# Patient Record
Sex: Female | Born: 1975
Health system: Southern US, Community
[De-identification: ages and names within clinical notes are randomized; demographics above are authoritative.]

## PROBLEM LIST (undated history)

## (undated) ENCOUNTER — Inpatient Hospital Stay (HOSPITAL_COMMUNITY): Payer: Self-pay

## (undated) DIAGNOSIS — E669 Obesity, unspecified: Secondary | ICD-10-CM

## (undated) DIAGNOSIS — I10 Essential (primary) hypertension: Secondary | ICD-10-CM

## (undated) DIAGNOSIS — E119 Type 2 diabetes mellitus without complications: Secondary | ICD-10-CM

## (undated) HISTORY — DX: Type 2 diabetes mellitus without complications: E11.9

---

## 2005-12-26 ENCOUNTER — Emergency Department (HOSPITAL_COMMUNITY): Admission: EM | Admit: 2005-12-26 | Discharge: 2005-12-27 | Payer: Self-pay | Admitting: Emergency Medicine

## 2014-08-27 DIAGNOSIS — M25532 Pain in left wrist: Secondary | ICD-10-CM | POA: Insufficient documentation

## 2014-09-03 DIAGNOSIS — S62102D Fracture of unspecified carpal bone, left wrist, subsequent encounter for fracture with routine healing: Secondary | ICD-10-CM | POA: Insufficient documentation

## 2014-09-03 HISTORY — DX: Fracture of unspecified carpal bone, left wrist, subsequent encounter for fracture with routine healing: S62.102D

## 2015-04-11 ENCOUNTER — Ambulatory Visit (INDEPENDENT_AMBULATORY_CARE_PROVIDER_SITE_OTHER): Payer: BLUE CROSS/BLUE SHIELD | Admitting: Certified Nurse Midwife

## 2015-04-11 ENCOUNTER — Encounter: Payer: Self-pay | Admitting: Certified Nurse Midwife

## 2015-04-11 DIAGNOSIS — Z0000 Encounter for general adult medical examination without abnormal findings: Secondary | ICD-10-CM | POA: Diagnosis not present

## 2015-04-11 DIAGNOSIS — R7309 Other abnormal glucose: Secondary | ICD-10-CM | POA: Diagnosis not present

## 2015-04-11 DIAGNOSIS — Z01419 Encounter for gynecological examination (general) (routine) without abnormal findings: Secondary | ICD-10-CM

## 2015-04-11 LAB — COMPREHENSIVE METABOLIC PANEL
ALT: 12 U/L (ref 6–29)
AST: 15 U/L (ref 10–30)
Albumin: 4.2 g/dL (ref 3.6–5.1)
Alkaline Phosphatase: 86 U/L (ref 33–115)
BUN: 10 mg/dL (ref 7–25)
CALCIUM: 9.5 mg/dL (ref 8.6–10.2)
CO2: 28 mmol/L (ref 20–31)
Chloride: 102 mmol/L (ref 98–110)
Creat: 0.78 mg/dL (ref 0.50–1.10)
GLUCOSE: 95 mg/dL (ref 65–99)
POTASSIUM: 4.3 mmol/L (ref 3.5–5.3)
Sodium: 139 mmol/L (ref 135–146)
Total Bilirubin: 0.3 mg/dL (ref 0.2–1.2)
Total Protein: 7.9 g/dL (ref 6.1–8.1)

## 2015-04-11 LAB — CBC WITH DIFFERENTIAL/PLATELET
Basophils Absolute: 0 10*3/uL (ref 0.0–0.1)
Basophils Relative: 0 % (ref 0–1)
Eosinophils Absolute: 0.3 10*3/uL (ref 0.0–0.7)
Eosinophils Relative: 3 % (ref 0–5)
HEMATOCRIT: 38.4 % (ref 36.0–46.0)
HEMOGLOBIN: 12.6 g/dL (ref 12.0–15.0)
LYMPHS ABS: 3.3 10*3/uL (ref 0.7–4.0)
LYMPHS PCT: 37 % (ref 12–46)
MCH: 26.9 pg (ref 26.0–34.0)
MCHC: 32.8 g/dL (ref 30.0–36.0)
MCV: 82.1 fL (ref 78.0–100.0)
MONO ABS: 1 10*3/uL (ref 0.1–1.0)
MONOS PCT: 11 % (ref 3–12)
MPV: 10.8 fL (ref 8.6–12.4)
NEUTROS ABS: 4.4 10*3/uL (ref 1.7–7.7)
NEUTROS PCT: 49 % (ref 43–77)
Platelets: 379 10*3/uL (ref 150–400)
RBC: 4.68 MIL/uL (ref 3.87–5.11)
RDW: 15.7 % — ABNORMAL HIGH (ref 11.5–15.5)
WBC: 8.9 10*3/uL (ref 4.0–10.5)

## 2015-04-11 LAB — TRIGLYCERIDES: Triglycerides: 93 mg/dL (ref <150)

## 2015-04-11 LAB — CHOLESTEROL, TOTAL: CHOLESTEROL: 175 mg/dL (ref 125–200)

## 2015-04-11 LAB — HDL CHOLESTEROL: HDL: 52 mg/dL (ref >=46)

## 2015-04-11 MED ORDER — METFORMIN HCL 500 MG PO TABS: 500.0000 mg | ORAL_TABLET | Freq: Two times a day (BID) | ORAL | Status: DC

## 2015-04-11 NOTE — Progress Notes (Signed)
Patient ID: Jacqueline Ward, female   DOB: October 31, 1975, 39 y.o.   MRN: [id number]    Subjective:     Jacqueline Ward is a 39 y.o. female here for a routine exam.  Current complaints: irregular periods, over the past two years has put on about 150# or more.  Discussed blood pressure.  Elevated today in office, encouraged f/u with PCP.  Lab work completed for general exam.  Stated just joined [employer] and is going to work out.  Has a hx of fibroids, states she will bring in report from abdominal CT scan.  Mother had uterine cancer 15 years ago.   Patient is a pediatrician.  Discussed nutrition.  Does not desire to have gastric bypass surgery.  Declines blood STD testing.   Is having irregular periods, last two months have been regular, with periods lasting about 5 days.    Personal health questionnaire:  Is patient Ashkenazi Jewish, have a family history of breast and/or ovarian cancer: yes Is there a family history of uterine cancer diagnosed at age < 28, gastrointestinal cancer, urinary tract cancer, family member who is a Personnel officer syndrome-associated carrier: no Is the patient overweight and hypertensive, family history of diabetes, personal history of gestational diabetes, preeclampsia or PCOS: yes Is patient over 79, have PCOS,  family history of premature CHD under age 84, diabetes, smoke, have hypertension or peripheral artery disease:  yes At any time, has a partner hit, kicked or otherwise hurt or frightened you?: no Over the past 2 weeks, have you felt down, depressed or hopeless?: no Over the past 2 weeks, have you felt little interest or pleasure in doing things?:no   Gynecologic History Patient's last menstrual period was 04/03/2015. Contraception: abstinence Last Pap: 4 years ago. Results were: normal according to the patient.   Last mammogram: None.   Obstetric History OB History  Gravida Para Term Preterm AB SAB TAB Ectopic Multiple Living         History  reviewed. No pertinent past medical history.  History reviewed. No pertinent past surgical history.   Current outpatient prescriptions:  .  esomeprazole (NEXIUM) 20 MG capsule, Take 20 mg by mouth daily at 12 noon., Disp: , Rfl:  .  Probiotic Product (PROBIOTIC ADVANCED PO), Take by mouth., Disp: , Rfl:  No Known Allergies  Social History  Substance Use Topics  . Smoking status: Never Smoker   . Smokeless tobacco: Never Used  . Alcohol Use: No    Family History  Problem Relation Age of Onset  . Cancer Mother   . Hypertension Mother   . Pancreatitis Father   . Diabetes Father       Review of Systems  Constitutional: negative for fatigue and weight loss, + weight gain Respiratory: negative for cough and wheezing Cardiovascular: negative for chest pain, fatigue and palpitations Gastrointestinal: negative for abdominal pain and change in bowel habits Musculoskeletal:negative for myalgias Neurological: negative for gait problems and tremors Behavioral/Psych: negative for abusive relationship, depression Endocrine: negative for temperature intolerance   Genitourinary:negative for abnormal menstrual periods, genital lesions, hot flashes, sexual problems and vaginal discharge Integument/breast: negative for breast lump, breast tenderness, nipple discharge and skin lesion(s)    Objective:       Temp(Src) 99 F (37.2 C)  Ht 5' 6.5" (1.689 m)  Wt 311 lb (141.069 kg)  BMI 49.45 kg/m2  LMP 04/03/2015 General:   alert  Skin:   no rash  or abnormalities  Lungs:   clear to auscultation bilaterally  Heart:   regular rate and rhythm, S1, S2 normal, no murmur, click, rub or gallop  Breasts:   normal without suspicious masses, skin or nipple changes or axillary nodes  Abdomen:  normal findings: no organomegaly, soft, non-tender and no hernia obese  Pelvis:  External genitalia: normal general appearance Urinary system: urethral meatus normal and bladder without fullness,  nontender Vaginal: normal without tenderness, induration or masses Cervix: normal appearance Adnexa: normal bimanual exam, difficult to assess d/t body habitus Uterus: retroverted and non-tender, normal size   Lab Review Urine pregnancy test Labs reviewed no Radiologic studies reviewed no  50% of 30 min visit spent on counseling and coordination of care.   Assessment:    Healthy female exam.   Morbid obesity  Hx of elevated HgbA1C  Hx of uterine fibroids  Irregular periods  Plan:   F/U with PCP for elevated blood pressure in office today.   Education reviewed: calcium supplements, depression evaluation, low fat, low cholesterol diet, safe sex/STD prevention, self breast exams, skin cancer screening and weight bearing exercise. Contraception: abstinence. Mammogram ordered. Follow up in: 3 months.   Meds ordered this encounter  Medications  . Probiotic Product (PROBIOTIC ADVANCED PO)    Sig: Take by mouth.  . esomeprazole (NEXIUM) 20 MG capsule    Sig: Take 20 mg by mouth daily at 12 noon.   No orders of the defined types were placed in this encounter.

## 2015-04-12 LAB — HEMOGLOBIN A1C
HEMOGLOBIN A1C: 7.2 % — AB (ref <5.7)
MEAN PLASMA GLUCOSE: 160 mg/dL — AB (ref <117)

## 2015-04-13 LAB — PAP, TP IMAGING W/ HPV RNA, RFLX HPV TYPE 16,18/45: HPV mRNA, High Risk: NOT DETECTED

## 2015-07-10 ENCOUNTER — Ambulatory Visit: Payer: BLUE CROSS/BLUE SHIELD | Admitting: Certified Nurse Midwife

## 2015-07-11 ENCOUNTER — Ambulatory Visit: Payer: BLUE CROSS/BLUE SHIELD | Admitting: Certified Nurse Midwife

## 2015-08-07 ENCOUNTER — Ambulatory Visit: Payer: BLUE CROSS/BLUE SHIELD | Admitting: Certified Nurse Midwife

## 2016-02-09 ENCOUNTER — Encounter (HOSPITAL_COMMUNITY): Payer: Self-pay | Admitting: Emergency Medicine

## 2016-02-09 ENCOUNTER — Emergency Department (HOSPITAL_COMMUNITY)
Admission: EM | Admit: 2016-02-09 | Discharge: 2016-02-09 | Disposition: A | Discharge status: Home / Self Care | Payer: BLUE CROSS/BLUE SHIELD | Attending: Emergency Medicine | Admitting: Emergency Medicine

## 2016-02-09 ENCOUNTER — Emergency Department (HOSPITAL_COMMUNITY): Payer: BLUE CROSS/BLUE SHIELD

## 2016-02-09 DIAGNOSIS — R000 Tachycardia, unspecified: Secondary | ICD-10-CM | POA: Diagnosis not present

## 2016-02-09 DIAGNOSIS — R51 Headache: Secondary | ICD-10-CM | POA: Diagnosis present

## 2016-02-09 DIAGNOSIS — Z7982 Long term (current) use of aspirin: Secondary | ICD-10-CM | POA: Insufficient documentation

## 2016-02-09 DIAGNOSIS — I10 Essential (primary) hypertension: Secondary | ICD-10-CM | POA: Diagnosis not present

## 2016-02-09 LAB — CBC
HCT: 39.4 % (ref 36.0–46.0)
Hemoglobin: 13.5 g/dL (ref 12.0–15.0)
MCH: 27.5 pg (ref 26.0–34.0)
MCHC: 34.3 g/dL (ref 30.0–36.0)
MCV: 80.2 fL (ref 78.0–100.0)
PLATELETS: 397 10*3/uL (ref 150–400)
RBC: 4.91 MIL/uL (ref 3.87–5.11)
RDW: 14.9 % (ref 11.5–15.5)
WBC: 9.6 10*3/uL (ref 4.0–10.5)

## 2016-02-09 LAB — COMPREHENSIVE METABOLIC PANEL
ALK PHOS: 92 U/L (ref 38–126)
ALT: 19 U/L (ref 14–54)
AST: 17 U/L (ref 15–41)
Albumin: 4.2 g/dL (ref 3.5–5.0)
Anion gap: 8 (ref 5–15)
BUN: 12 mg/dL (ref 6–20)
CALCIUM: 9.2 mg/dL (ref 8.9–10.3)
CO2: 26 mmol/L (ref 22–32)
CREATININE: 0.74 mg/dL (ref 0.44–1.00)
Chloride: 103 mmol/L (ref 101–111)
Glucose, Bld: 104 mg/dL — ABNORMAL HIGH (ref 65–99)
Potassium: 3.6 mmol/L (ref 3.5–5.1)
SODIUM: 137 mmol/L (ref 135–145)
Total Bilirubin: 0.3 mg/dL (ref 0.3–1.2)
Total Protein: 8.8 g/dL — ABNORMAL HIGH (ref 6.5–8.1)

## 2016-02-09 LAB — LIPASE, BLOOD: Lipase: 24 U/L (ref 11–51)

## 2016-02-09 MED ORDER — LABETALOL HCL 5 MG/ML IV SOLN
10.0000 mg | Freq: Once | INTRAVENOUS | Status: AC
  Administered 2016-02-09: 10 mg via INTRAVENOUS
  Filled 2016-02-09: qty 4

## 2016-02-09 MED ORDER — HYDROCHLOROTHIAZIDE 25 MG PO TABS: 25.0000 mg | ORAL_TABLET | Freq: Every day | ORAL | Status: DC

## 2016-02-09 NOTE — ED Notes (Signed)
Pt from work with complaints of hypertension, headache, and nausea. Pt states she has had symptoms for a week and a half. 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 Pt states she has felt her heart racing on and off in this week. Pt also states that she is feeling anxious at this time. Pt states that her HA has subsided since she took 81mg  of motrin aroun 1430. Pt states she has been told she has htn, but never prescribed anything for it

## 2016-02-09 NOTE — Discharge Instructions (Signed)
Hypertension Hypertension, commonly called high blood pressure, is when the force of blood pumping through your arteries is too strong. Your arteries are the blood vessels that carry blood from your heart throughout your body. A blood pressure reading consists of a higher number over a lower number, such as 110/72. The higher number (systolic) is the pressure inside your arteries when your heart pumps. The lower number (diastolic) is the pressure inside your arteries when your heart relaxes. Ideally you want your blood pressure below 120/80. Hypertension forces your heart to work harder to pump blood. Your arteries may become narrow or stiff. Having untreated or uncontrolled hypertension can cause heart attack, stroke, kidney disease, and other problems. RISK FACTORS Some risk factors for high blood pressure are controllable. Others are not.  Risk factors you cannot control include:   Race. You may be at higher risk if you are African American.  Age. Risk increases with age.  Gender. Men are at higher risk than women before age 45 years. After age 65, women are at higher risk than men. Risk factors you can control include:  Not getting enough exercise or physical activity.  Being overweight.  Getting too much fat, sugar, calories, or salt in your diet.  Drinking too much alcohol. SIGNS AND SYMPTOMS Hypertension does not usually cause signs or symptoms. Extremely high blood pressure (hypertensive crisis) may cause headache, anxiety, shortness of breath, and nosebleed. DIAGNOSIS To check if you have hypertension, your health care provider will measure your blood pressure while you are seated, with your arm held at the level of your heart. It should be measured at least twice using the same arm. Certain conditions can cause a difference in blood pressure between your right and left arms. A blood pressure reading that is higher than normal on one occasion does not mean that you need treatment. If  it is not clear whether you have high blood pressure, you may be asked to return on a different day to have your blood pressure checked again. Or, you may be asked to monitor your blood pressure at home for 1 or more weeks. TREATMENT Treating high blood pressure includes making lifestyle changes and possibly taking medicine. Living a healthy lifestyle can help lower high blood pressure. You may need to change some of your habits. Lifestyle changes may include:  Following the DASH diet. This diet is high in fruits, vegetables, and whole grains. It is low in salt, red meat, and added sugars.  Keep your sodium intake below 2,300 mg per day.  Getting at least 30-45 minutes of aerobic exercise at least 4 times per week.  Losing weight if necessary.  Not smoking.  Limiting alcoholic beverages.  Learning ways to reduce stress. Your health care provider may prescribe medicine if lifestyle changes are not enough to get your blood pressure under control, and if one of the following is true:  You are 18-59 years of age and your systolic blood pressure is above 140.  You are 60 years of age or older, and your systolic blood pressure is above 150.  Your diastolic blood pressure is above 90.  You have diabetes, and your systolic blood pressure is over 140 or your diastolic blood pressure is over 90.  You have kidney disease and your blood pressure is above 140/90.  You have heart disease and your blood pressure is above 140/90. Your personal target blood pressure may vary depending on your medical conditions, your age, and other factors. HOME CARE INSTRUCTIONS    Have your blood pressure rechecked as directed by your health care provider.   Take medicines only as directed by your health care provider. Follow the directions carefully. Blood pressure medicines must be taken as prescribed. The medicine does not work as well when you skip doses. Skipping doses also puts you at risk for  problems.  Do not smoke.   Monitor your blood pressure at home as directed by your health care provider. SEEK MEDICAL CARE IF:   You think you are having a reaction to medicines taken.  You have recurrent headaches or feel dizzy.  You have swelling in your ankles.  You have trouble with your vision. SEEK IMMEDIATE MEDICAL CARE IF:  You develop a severe headache or confusion.  You have unusual weakness, numbness, or feel faint.  You have severe chest or abdominal pain.  You vomit repeatedly.  You have trouble breathing. MAKE SURE YOU:   Understand these instructions.  Will watch your condition.  Will get help right away if you are not doing well or get worse.   This information is not intended to replace advice given to you by your health care provider. Make sure you discuss any questions you have with your health care provider.   Document Released: 07/20/2005 Document Revised: 12/04/2014 Document Reviewed: 05/12/2013 Elsevier Interactive Patient Education 2016 Elsevier Inc.  

## 2016-02-09 NOTE — ED Provider Notes (Signed)
CSN: [id number]     Arrival date & time 02/09/16  1624 History   First MD Initiated Contact with Patient 02/09/16 1650     Chief Complaint  Patient presents with  . Nausea  . Hypertension  . Headache     (Consider location/radiation/quality/duration/timing/severity/associated sxs/prior Treatment) HPI Comments: 40 year old female presents with one-day history of increased blood pressure without headache and nausea. Symptoms present for approximately week and half and worse today. Denies any visual changes. No ataxia. Diagnosed with diabetes in the past but is not taking medication at this time. No prior history of taking medication for hypertension. Denies any chest pain or shortness of breath. She did take aspirin prior to arrival.  Patient is a 40 y.o. female presenting with hypertension and headaches. The history is provided by the patient.  Hypertension Associated symptoms include headaches.  Headache   History reviewed. No pertinent past medical history. History reviewed. No pertinent past surgical history. Family History  Problem Relation Age of Onset  . Cancer Mother   . Hypertension Mother   . Pancreatitis Father   . Diabetes Father    Social History  Substance Use Topics  . Smoking status: Never Smoker   . Smokeless tobacco: Never Used  . Alcohol Use: No   OB History    Gravida Para Term Preterm AB TAB SAB Ectopic Multiple Living       Review of Systems  Neurological: Positive for headaches.  All other systems reviewed and are negative.     Allergies  Review of patient's allergies indicates no known allergies.  Home Medications   Prior to Admission medications   Medication Sig Start Date End Date Taking? Authorizing Provider  aspirin EC 81 MG tablet Take 81 mg by mouth daily as needed (high blood pressure).   Yes Historical Provider, MD  metFORMIN (GLUCOPHAGE) 500 MG tablet Take 1 tablet (500 mg total) by mouth 2 (two) times daily with  a meal. Patient not taking: Reported on 02/09/2016 04/11/15   Rachelle A Denney, CNM   BP 197/142 mmHg  Pulse 123  Temp(Src) 98.4 F (36.9 C) (Oral)  Resp 18  SpO2 97%  LMP 01/30/2016 Physical Exam  Constitutional: She is oriented to person, place, and time. She appears well-developed and well-nourished.  Non-toxic appearance. No distress.  HENT:  Head: Normocephalic and atraumatic.  Eyes: Conjunctivae, EOM and lids are normal. Pupils are equal, round, and reactive to light.  Neck: Normal range of motion. Neck supple. No tracheal deviation present. No thyroid mass present.  Cardiovascular: Regular rhythm and normal heart sounds.  Tachycardia present.  Exam reveals no gallop.   No murmur heard. Pulmonary/Chest: Effort normal and breath sounds normal. No stridor. No respiratory distress. She has no decreased breath sounds. She has no wheezes. She has no rhonchi. She has no rales.  Abdominal: Soft. Normal appearance and bowel sounds are normal. She exhibits no distension. There is no tenderness. There is no rebound and no CVA tenderness.  Musculoskeletal: Normal range of motion. She exhibits no edema or tenderness.  Neurological: She is alert and oriented to person, place, and time. She has normal strength. No cranial nerve deficit or sensory deficit. GCS eye subscore is 4. GCS verbal subscore is 5. GCS motor subscore is 6.  Skin: Skin is warm and dry. No abrasion and no rash noted.  Psychiatric: She has a normal mood and affect. Her speech is normal and behavior is normal.  Nursing note and vitals reviewed.   ED Course  Procedures (including critical care time) Labs Review Labs Reviewed  CBC  LIPASE, BLOOD  COMPREHENSIVE METABOLIC PANEL  URINALYSIS, ROUTINE W REFLEX MICROSCOPIC (NOT AT [facility])    Imaging Review No results found. I have personally reviewed and evaluated these images and lab results as part of my medical decision-making.   EKG Interpretation   Date/Time:  Sunday  February 09 2016 17:16:13 EDT Ventricular Rate:  109 PR Interval:    QRS Duration: 97 QT Interval:  362 QTC Calculation: 488 R Axis:   59 Text Interpretation:  Sinus tachycardia Borderline prolonged QT interval  Confirmed by Freida Busman  MD, Kherington Meraz ([id number]) on 02/09/2016 5:37:07 PM      MDM   Final diagnoses:  None    Patient had blood pressure treated with 2 mg of IV labetalol. She feels better at this time. Headache has resolved. Head CT without acute findings. Renal function is normal. Will place patient on diuretic and she has a follow-up scheduled with a new primary care provider.    Lorre Nick, MD 02/09/16 1850

## 2016-02-27 DIAGNOSIS — E119 Type 2 diabetes mellitus without complications: Secondary | ICD-10-CM | POA: Insufficient documentation

## 2016-03-05 DIAGNOSIS — I10 Essential (primary) hypertension: Secondary | ICD-10-CM

## 2017-08-03 DIAGNOSIS — K115 Sialolithiasis: Secondary | ICD-10-CM

## 2017-08-03 HISTORY — DX: Sialolithiasis: K11.5

## 2018-04-19 ENCOUNTER — Emergency Department (HOSPITAL_COMMUNITY)
Admission: EM | Admit: 2018-04-19 | Discharge: 2018-04-19 | Disposition: A | Discharge status: Home / Self Care | Payer: BLUE CROSS/BLUE SHIELD | Attending: Emergency Medicine | Admitting: Emergency Medicine

## 2018-04-19 ENCOUNTER — Encounter (HOSPITAL_COMMUNITY): Payer: Self-pay | Admitting: Emergency Medicine

## 2018-04-19 DIAGNOSIS — I10 Essential (primary) hypertension: Secondary | ICD-10-CM | POA: Insufficient documentation

## 2018-04-19 DIAGNOSIS — Z79899 Other long term (current) drug therapy: Secondary | ICD-10-CM | POA: Insufficient documentation

## 2018-04-19 DIAGNOSIS — R0789 Other chest pain: Secondary | ICD-10-CM

## 2018-04-19 HISTORY — DX: Obesity, unspecified: E66.9

## 2018-04-19 HISTORY — DX: Essential (primary) hypertension: I10

## 2018-04-19 LAB — BASIC METABOLIC PANEL
Anion gap: 9 (ref 5–15)
BUN: 14 mg/dL (ref 6–20)
CHLORIDE: 101 mmol/L (ref 98–111)
CO2: 29 mmol/L (ref 22–32)
Calcium: 8.9 mg/dL (ref 8.9–10.3)
Creatinine, Ser: 0.77 mg/dL (ref 0.44–1.00)
GFR calc Af Amer: >60 mL/min (ref >60)
GFR calc non Af Amer: >60 mL/min (ref >60)
GLUCOSE: 122 mg/dL — AB (ref 70–99)
POTASSIUM: 3.4 mmol/L — AB (ref 3.5–5.1)
Sodium: 139 mmol/L (ref 135–145)

## 2018-04-19 LAB — CBC WITH DIFFERENTIAL/PLATELET
Basophils Absolute: 0 10*3/uL (ref 0.0–0.1)
Basophils Relative: 0 %
EOS PCT: 3 %
Eosinophils Absolute: 0.2 10*3/uL (ref 0.0–0.7)
HCT: 37.7 % (ref 36.0–46.0)
Hemoglobin: 12.2 g/dL (ref 12.0–15.0)
LYMPHS ABS: 2.6 10*3/uL (ref 0.7–4.0)
LYMPHS PCT: 41 %
MCH: 27.1 pg (ref 26.0–34.0)
MCHC: 32.4 g/dL (ref 30.0–36.0)
MCV: 83.8 fL (ref 78.0–100.0)
MONO ABS: 0.4 10*3/uL (ref 0.1–1.0)
MONOS PCT: 6 %
Neutro Abs: 3.1 10*3/uL (ref 1.7–7.7)
Neutrophils Relative %: 50 %
PLATELETS: 419 10*3/uL — AB (ref 150–400)
RBC: 4.5 MIL/uL (ref 3.87–5.11)
RDW: 14.7 % (ref 11.5–15.5)
WBC: 6.3 10*3/uL (ref 4.0–10.5)

## 2018-04-19 LAB — URINALYSIS, ROUTINE W REFLEX MICROSCOPIC
BILIRUBIN URINE: NEGATIVE
Glucose, UA: NEGATIVE mg/dL
Hgb urine dipstick: NEGATIVE
Ketones, ur: NEGATIVE mg/dL
Leukocytes, UA: NEGATIVE
NITRITE: NEGATIVE
PH: 6 (ref 5.0–8.0)
Protein, ur: NEGATIVE mg/dL
SPECIFIC GRAVITY, URINE: 1.015 (ref 1.005–1.030)

## 2018-04-19 LAB — I-STAT TROPONIN, ED: Troponin i, poc: 0 ng/mL (ref 0.00–0.08)

## 2018-04-19 MED ORDER — CLONIDINE HCL 0.1 MG PO TABS
0.1000 mg | ORAL_TABLET | Freq: Once | ORAL | Status: AC
Start: 2018-04-19 — End: 2018-04-19
  Administered 2018-04-19: 0.1 mg via ORAL
  Filled 2018-04-19: qty 1

## 2018-04-19 NOTE — ED Notes (Signed)
ED Provider at bedside. 

## 2018-04-19 NOTE — ED Provider Notes (Signed)
[facility]-EMERGENCY DEPT Provider Note   CSN: [id number] Arrival date & time: 04/19/18  1038     History   Chief Complaint Chief Complaint  Patient presents with  . Hypertension  . Chest Pain    HPI Jacqueline Ward is a 42 y.o. female.  She presents here from employee health clinic with elevated blood pressure and some vague chest tightness.  She has a history of hypertension and is on medication for that she has been taking.  She has not had her blood pressure checked in a while so she does not know if this is a new change.  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  She says the chest discomfort is new but she says it is mild and she would make much of it.  No cough no shortness of breath no abdominal pain no nausea no vomiting.  No headache or blurry vision.  The history is provided by the patient.  Chest Pain   This is a new problem. The current episode started 1 to 2 hours ago. The problem occurs constantly. The problem has not changed since onset.The pain is present in the lateral region. The pain is mild. The quality of the pain is described as pressure-like. The pain does not radiate. Pertinent negatives include no abdominal pain, no cough, no diaphoresis, no fever, no headaches, no shortness of breath and no syncope. She has tried nothing for the symptoms. The treatment provided no relief.  Her past medical history is significant for hypertension.    Past Medical History:  Diagnosis Date  . Hypertension   . Obese     Patient Active Problem List   Diagnosis Date Noted  . Morbid obesity (HCC) 04/11/2015    History reviewed. No pertinent surgical history.   OB History    Gravida  0   Para  0   Term  0   Preterm  0   AB  0   Living  0     SAB  0   TAB  0   Ectopic  0   Multiple  0   Live Births                 Home Medications    Prior to Admission medications   Medication Sig Start Date End Date Taking? Authorizing Provider  aspirin EC 81 MG tablet Take 81 mg by mouth daily as needed (high blood pressure).    [provider]  hydrochlorothiazide (HYDRODIURIL) 25 MG tablet Take 1 tablet (25 mg total) by mouth daily. 02/09/16   Lorre Nick, MD  metFORMIN (GLUCOPHAGE) 500 MG tablet Take 1 tablet (500 mg total) by mouth 2 (two) times daily with a meal. Patient not taking: Reported on 02/09/2016 04/11/15   Roe Coombs, CNM    Family History Family History  Problem Relation Age of Onset  . Cancer Mother   . Hypertension Mother   . Pancreatitis Father   . Diabetes Father     Social History Social History   Tobacco Use  . Smoking status: Never Smoker  . Smokeless tobacco: Never Used  Substance Use Topics  . Alcohol use: No    Alcohol/week: 0.0 standard drinks  . Drug use: No     Allergies   Patient has no known allergies.  Review of Systems Review of Systems  Constitutional: Negative for diaphoresis and fever.  HENT: Negative for sore throat.   Eyes: Negative for visual disturbance.  Respiratory: Negative for cough and shortness of breath.   Cardiovascular: Positive for chest pain. Negative for syncope.  Gastrointestinal: Negative for abdominal pain.  Genitourinary: Negative for dysuria.  Musculoskeletal: Negative for neck pain.  Skin: Negative for rash.  Neurological: Negative for headaches.     Physical Exam Updated Vital Signs BP (!) 192/123 (BP Location: Right Arm)   Pulse 99   Temp 98.8 F (37.1 C) (Oral)   Resp 18   Ht 5\' 6"  (1.676 m)   Wt (!) 141.1 kg   LMP 03/29/2018   SpO2 96%   BMI 50.21 kg/m   Physical Exam  Constitutional: She appears well-developed and well-nourished. No distress.  HENT:  Head: Normocephalic and atraumatic.  Eyes: Conjunctivae are normal.  Neck: Neck supple.  Cardiovascular: Normal rate, regular rhythm  and normal pulses.  No murmur heard. Pulmonary/Chest: Effort normal and breath sounds normal. No respiratory distress.  Abdominal: Soft. There is no tenderness.  Musculoskeletal: She exhibits no edema.       Right lower leg: She exhibits no tenderness.       Left lower leg: She exhibits no tenderness.  Neurological: She is alert.  Skin: Skin is warm and dry. Capillary refill takes less than 2 seconds.  Psychiatric: She has a normal mood and affect.  Nursing note and vitals reviewed.    ED Treatments / Results  Labs (all labs ordered are listed, but only abnormal results are displayed) Labs Reviewed  BASIC METABOLIC PANEL - Abnormal; Notable for the following components:      Result Value   Potassium 3.4 (*)    Glucose, Bld 122 (*)    All other components within normal limits  CBC WITH DIFFERENTIAL/PLATELET - Abnormal; Notable for the following components:   Platelets 419 (*)    All other components within normal limits  URINALYSIS, ROUTINE W REFLEX MICROSCOPIC - Abnormal; Notable for the following components:   Color, Urine STRAW (*)    All other components within normal limits  I-STAT TROPONIN, ED    EKG EKG Interpretation  Date/Time:  Tuesday April 19 2018 10:58:56 EDT Ventricular Rate:  91 PR Interval:    QRS Duration: 97 QT Interval:  382 QTC Calculation: 470 R Axis:   77 Text Interpretation:  Sinus rhythm Baseline wander in lead(s) II III aVR aVF similar to prior 7/17 Confirmed by Meridee Score [phone]) on 04/19/2018 11:30:29 AM Also confirmed by Meridee Score [phone])  on 04/19/2018 1:45:05 PM   Radiology No results found.  Procedures Procedures (including critical care time)  Medications Ordered in ED Medications  cloNIDine (CATAPRES) tablet 0.1 mg (0.1 mg Oral Given 04/19/18 1409)     Initial Impression / Assessment and Plan / ED Course  I have reviewed the triage vital signs and the nursing notes.  Pertinent labs & imaging results that were  available during my care of the patient were reviewed by me and considered in my medical decision making (see chart for details).  Clinical Course as of Apr 20 1743  Tue Apr 19, 2018  1345 Reviewed the patient's results with her and she is comfortable being discharged.  She still got diastolics of 120 although no symptoms so we will give her a dose of clonidine before she goes and she understands to follow-up with her primary for further BP management.   [  MB]    Clinical Course User Index [MB] Terrilee Files, MD     Final Clinical Impressions(s) / ED Diagnoses   Final diagnoses:  Essential hypertension  Atypical chest pain    ED Discharge Orders    None       Terrilee Files, MD 04/19/18 1744

## 2018-04-19 NOTE — Discharge Instructions (Addendum)
You were evaluated in the emergency department for elevated blood pressure and some chest pain.  You had blood work EKG that did not show an obvious cause of your symptoms.  Your blood pressure is still elevated here and we are giving you an oral dose of medication here to help lower it.  It will be important for you to follow-up with your primary care doctor for further management of your blood pressure and please return if any worsening symptoms.

## 2018-04-19 NOTE — ED Triage Notes (Signed)
Pt reports that she is a new [employer] employee and was doing her physical and her BP 183/117. Reports took her HTN meds this morning around 840am/ was referred to the ED for further evaluation.

## 2020-05-17 DIAGNOSIS — Z23 Encounter for immunization: Secondary | ICD-10-CM

## 2020-10-23 ENCOUNTER — Encounter (HOSPITAL_COMMUNITY): Payer: Self-pay

## 2020-10-23 ENCOUNTER — Other Ambulatory Visit: Payer: Self-pay

## 2020-10-23 ENCOUNTER — Ambulatory Visit (HOSPITAL_COMMUNITY)
Admission: RE | Admit: 2020-10-23 | Discharge: 2020-10-23 | Disposition: A | Discharge status: Home / Self Care | Payer: No Typology Code available for payment source | Source: Ambulatory Visit | Attending: Family Medicine | Admitting: Family Medicine

## 2020-10-23 VITALS — BP 150/99 | HR 101 | Temp 98.4°F | Resp 18

## 2020-10-23 DIAGNOSIS — R059 Cough, unspecified: Secondary | ICD-10-CM | POA: Diagnosis not present

## 2020-10-23 DIAGNOSIS — I10 Essential (primary) hypertension: Secondary | ICD-10-CM | POA: Diagnosis not present

## 2020-10-23 DIAGNOSIS — R062 Wheezing: Secondary | ICD-10-CM

## 2020-10-23 MED ORDER — AZITHROMYCIN 250 MG PO TABS: 250.0000 mg | ORAL_TABLET | Freq: Every day | ORAL | 0 refills | Status: DC

## 2020-10-23 MED ORDER — PREDNISONE 20 MG PO TABS: 40.0000 mg | ORAL_TABLET | Freq: Every day | ORAL | 0 refills | Status: DC

## 2020-10-23 MED ORDER — ALBUTEROL SULFATE HFA 108 (90 BASE) MCG/ACT IN AERS: 1.0000 | INHALATION_SPRAY | Freq: Four times a day (QID) | RESPIRATORY_TRACT | 2 refills | Status: AC | PRN

## 2020-10-23 NOTE — Discharge Instructions (Addendum)
Your blood pressure was noted to be elevated during your visit today. If you are currently taking medication for high blood pressure, please ensure you are taking this as directed. If you do not have a history of high blood pressure and your blood pressure remains persistently elevated, you may need to begin taking a medication at some point. You may return here within the next few days to recheck if unable to see your primary care provider or if you do not have a one.  BP (!) 150/99 (BP Location: Right Wrist)    Pulse (!) 101    Temp 98.4 F (36.9 C) (Oral)    Resp 18    LMP 10/01/2020    SpO2 96%   BP Readings from Last 3 Encounters:  10/23/20 (!) 150/99  04/19/18 (!) 181/114  02/09/16 150/82

## 2020-10-23 NOTE — ED Provider Notes (Addendum)
[facility]   [id number] 10/23/20 Arrival Time: 1345  ASSESSMENT & PLAN:  1. Cough   2. Wheezing   3. Elevated blood pressure reading in office with diagnosis of hypertension    No signs of PNA.  Begin: Meds ordered this encounter  Medications  . azithromycin (ZITHROMAX) 250 MG tablet    Sig: Take 1 tablet (250 mg total) by mouth daily. Take first 2 tablets together, then 1 every day until finished.    Dispense:  6 tablet    Refill:  0  . predniSONE (DELTASONE) 20 MG tablet    Sig: Take 2 tablets (40 mg total) by mouth daily.    Dispense:  10 tablet    Refill:  0  . albuterol (VENTOLIN HFA) 108 (90 Base) MCG/ACT inhaler    Sig: Inhale 1-2 puffs into the lungs every 6 (six) hours as needed for wheezing or shortness of breath.    Dispense:  1 each    Refill:  2     Discharge Instructions      Your blood pressure was noted to be elevated during your visit today. If you are currently taking medication for high blood pressure, please ensure you are taking this as directed. If you do not have a history of high blood pressure and your blood pressure remains persistently elevated, you may need to begin taking a medication at some point. You may return here within the next few days to recheck if unable to see your primary care provider or if you do not have a one.  BP (!) 150/99 (BP Location: Right Wrist)   Pulse (!) 101   Temp 98.4 F (36.9 C) (Oral)   Resp 18   LMP 10/01/2020   SpO2 96%   BP Readings from Last 3 Encounters:  10/23/20 (!) 150/99  04/19/18 (!) 181/114  02/09/16 150/82         Follow-up Information    Brock Bad, MD.   Specialty: Obstetrics and Gynecology Why: As needed. Contact information: [address] [place] [address] [phone]        [facility] [place].   Specialty: Urgent Care Why: If worsening or failing to improve as anticipated. Contact information: [address] [place] [address] [phone]              Reviewed expectations re: course of current medical issues. Questions answered. Outlined signs and symptoms indicating need for more acute intervention. Understanding verbalized. After Visit Summary given.   SUBJECTIVE: History from: patient. Jacqueline Ward is a pleasant 45 y.o. female who presents with worries regarding a persisent cough x 3 weeks; feels allergies initiated coughing with worsening wheezing. No CP reported. Inhaler with minimal relief. Ambulatory without difficulty. Afebrile. Reports h/o bronchitis.  OBJECTIVE:  Vitals:   10/23/20 1407  BP: (!) 150/99  Pulse: (!) 101  Resp: 18  Temp: 98.4 F (36.9 C)  TempSrc: Oral  SpO2: 96%    General appearance: alert; no distress Eyes: conjunctiva normal HENT: [place]; AT; without significant nasal congestion Neck: supple  Lungs: speaks full sentences without difficulty; unlabored; bilateral wheezing with coarse breath sounds more prominent on the left Skin: warm and dry Neurologic: normal gait Psychological: alert and cooperative; normal mood and affect   No Known Allergies  Past Medical History:  Diagnosis Date  . Hypertension   . Obese    Social History   Socioeconomic History  . Marital status:  Single    Spouse name: Not on file  . Number of children: Not on file  . Years of education: Not on file  . Highest education level: Not on file  Occupational History  . Not on file  Tobacco Use  . Smoking status: Never Smoker  . Smokeless tobacco: Never Used  Vaping Use  . Vaping Use: Never used  Substance and Sexual Activity  . Alcohol use: No    Alcohol/week: 0.0 standard drinks  . Drug use: No  . Sexual activity: Not Currently  Other Topics Concern  . Not on file  Social History Narrative  . Not on file   Social Determinants of Health   Financial Resource Strain: Not on file  Food Insecurity: Not on file  Transportation Needs: Not  on file  Physical Activity: Not on file  Stress: Not on file  Social Connections: Not on file  Intimate Partner Violence: Not on file   Family History  Problem Relation Age of Onset  . Cancer Mother   . Hypertension Mother   . Pancreatitis Father   . Diabetes Father    History reviewed. No pertinent surgical history.   Mardella Layman, MD 10/23/20 1429    Mardella Layman, MD 10/23/20 1430

## 2020-10-23 NOTE — ED Triage Notes (Signed)
Pt states that she has nasal congestion, cough, chest congestion, and wheezing. Pt states that she has had the cough for three weeks but everything got worst this past Friday

## 2020-12-26 ENCOUNTER — Ambulatory Visit: Payer: No Typology Code available for payment source | Admitting: Nurse Practitioner

## 2020-12-27 ENCOUNTER — Encounter: Payer: Self-pay | Admitting: Nurse Practitioner

## 2020-12-27 ENCOUNTER — Other Ambulatory Visit: Payer: Self-pay

## 2020-12-27 ENCOUNTER — Ambulatory Visit (INDEPENDENT_AMBULATORY_CARE_PROVIDER_SITE_OTHER): Payer: No Typology Code available for payment source | Admitting: Nurse Practitioner

## 2020-12-27 DIAGNOSIS — R0600 Dyspnea, unspecified: Secondary | ICD-10-CM | POA: Insufficient documentation

## 2020-12-27 DIAGNOSIS — Z139 Encounter for screening, unspecified: Secondary | ICD-10-CM | POA: Diagnosis not present

## 2020-12-27 DIAGNOSIS — R0609 Other forms of dyspnea: Secondary | ICD-10-CM

## 2020-12-27 DIAGNOSIS — Z7689 Persons encountering health services in other specified circumstances: Secondary | ICD-10-CM

## 2020-12-27 DIAGNOSIS — R601 Generalized edema: Secondary | ICD-10-CM

## 2020-12-27 DIAGNOSIS — IMO0002 Reserved for concepts with insufficient information to code with codable children: Secondary | ICD-10-CM | POA: Insufficient documentation

## 2020-12-27 DIAGNOSIS — Z124 Encounter for screening for malignant neoplasm of cervix: Secondary | ICD-10-CM | POA: Insufficient documentation

## 2020-12-27 DIAGNOSIS — E8770 Fluid overload, unspecified: Secondary | ICD-10-CM | POA: Insufficient documentation

## 2020-12-27 DIAGNOSIS — E1165 Type 2 diabetes mellitus with hyperglycemia: Secondary | ICD-10-CM | POA: Diagnosis not present

## 2020-12-27 DIAGNOSIS — R000 Tachycardia, unspecified: Secondary | ICD-10-CM

## 2020-12-27 MED ORDER — FUROSEMIDE 80 MG PO TABS: 80.0000 mg | ORAL_TABLET | Freq: Two times a day (BID) | ORAL | 0 refills | Status: DC

## 2020-12-27 NOTE — Patient Instructions (Addendum)
Please have labs drawn today.  I ordered a CXR to check for pleural effusion and/or cardiomegaly.  We will meet back up in 1 week for a physical and to see how the lasix is working with your edema.  For weeping legs, try compression wraps and elevating your legs. I imagine that the weeping will soak into the bandage/dressing, so that will have to be changed frequently PRN to prevent skin maceration.  If your shortness of breath gets worse, I would go to the emergency department immediately for an emergent work-up and thorough cardiac eval .

## 2020-12-27 NOTE — Assessment & Plan Note (Signed)
-  obtain records 

## 2020-12-27 NOTE — Assessment & Plan Note (Signed)
Lab Results  Component Value Date   HGBA1C 7.2 (H) 04/11/2015   -will check A1c with labs -Not currently on ACEi/ARB or statin

## 2020-12-27 NOTE — Progress Notes (Signed)
New Patient Office Visit  Subjective:  Patient ID: Jacqueline Ward, female    DOB: July 11, 1976  Age: 45 y.o. MRN: [id number]  CC:  Chief Complaint  Patient presents with  . New Patient (Initial Visit)    Here to establish care.  . Edema    Legs, feet, and abdomen. Ongoing x3 weeks.  . Shortness of Breath    Ongoing x5 days.    HPI Jacqueline Ward presents for new patient visit. No recent PCP. Last physical and labs were performed over a year ago. She states that she went to any lab now in Jan 2022. She is fasting today.  She has hx of DM, and last record A1c was 7.2 on 04/11/15.  Today, she reports abdominal swelling and fluid that is draining through her abdomen. She has noticed some exercise intolerance. She has been working with a Clinical research associate and got down to 280# as recently as 2 months ago. She was going to the gym 3-4 days ago, and then she started having leg swelling.  She has been taking lasix 40 mg, but has not been taking HCTZ because she has bene taking lasix.  She is having some dyspnea.  She was feeling heaviness with cardio in her abdomen that started a few months ago. In the last 5 days she started to notice abdominal swelling and dyspnea.   Past Medical History:  Diagnosis Date  . Diabetes mellitus without complication ([place])   . Hypertension   . Obese   . Sialolithiasis of submandibular gland 2019    Past Surgical History:  Procedure Laterality Date  . LIPECTOMY  2004    Family History  Problem Relation Age of Onset  . Cancer Mother   . Hypertension Mother   . Pancreatitis Father   . Diabetes Father     Social History   Socioeconomic History  . Marital status: Single    Spouse name: Not on file  . Number of children: 0  . Years of education: Not on file  . Highest education level: Not on file  Occupational History  . Not on file  Tobacco Use  . Smoking status: Never Smoker  . Smokeless tobacco: Never Used  Vaping Use  . Vaping Use: Never used   Substance and Sexual Activity  . Alcohol use: No    Alcohol/week: 0.0 standard drinks  . Drug use: No  . Sexual activity: Not Currently  Other Topics Concern  . Not on file  Social History Narrative  . Not on file   Social Determinants of Health   Financial Resource Strain: Not on file  Food Insecurity: Not on file  Transportation Needs: Not on file  Physical Activity: Not on file  Stress: Not on file  Social Connections: Not on file  Intimate Partner Violence: Not on file    ROS Review of Systems  Constitutional: Positive for unexpected weight change.       54 pound weight gain in 2 months  Respiratory: Positive for shortness of breath.        Orthopnea and exertional dyspnea  Cardiovascular: Positive for leg swelling. Negative for chest pain and palpitations.       2-3+ pitting edema from feet to abdomen; has fluid weeping from her legs  Gastrointestinal: Positive for abdominal distention.       Was feeling fluid in her abdomen when she exercises, but for last 5 days she can see and feel abdominal edema  Psychiatric/Behavioral: Negative.  Objective:   Today's Vitals: BP (!) 168/100 (BP Location: Right Arm, Patient Position: Sitting, Cuff Size: Large)   Pulse (!) 114   Temp (!) 97.1 F (36.2 C) (Temporal)   Ht 5' 6"  (1.676 m)   Wt (!) 337 lb (152.9 kg)   LMP 10/01/2020   SpO2 95%   BMI 54.39 kg/m   Physical Exam  Assessment & Plan:   Problem List Items Addressed This Visit      Endocrine   Type II diabetes mellitus, uncontrolled ([place])    Lab Results  Component Value Date   HGBA1C 7.2 (H) 04/11/2015   -will check A1c with labs -Not currently on ACEi/ARB or statin      Relevant Orders   Hemoglobin A1c   Microalbumin / creatinine urine ratio     Other   Morbid obesity ([place])     BMI Readings from Last 3 Encounters:  12/27/20 54.39 kg/m  04/19/18 50.21 kg/m  04/11/15 49.44 kg/m  -was followed by bariatrics at [facility] with last visit on  record 2017       Encounter to establish care    -obtain records      Relevant Orders   CBC with Differential/Platelet   CMP14+EGFR   Lipid Panel With LDL/HDL Ratio   Hemoglobin A1c   Microalbumin / creatinine urine ratio   Screening due    -will check HIV and HCV with next set of labs      Relevant Orders   Hepatitis C antibody   HIV Antibody (routine testing w rflx)   Exertional dyspnea    -EKG today -urgent referral to cardiology -INCREASE lasix to 40 mg BID x 1 week      Relevant Medications   furosemide (LASIX) 80 MG tablet   Other Relevant Orders   Ambulatory referral to Cardiology   DG Chest 2 View   Brain natriuretic peptide   Generalized edema due to fluid overload    -unsure of etiology -pt states Creatinine was 1.10 when she had labs drawn at [facility] in Jan 2022, so unless there is a drastic change, renal function has been ok; rechecking labs today -has morbid obesity, but no obvious signs of liver failure -concerned for cardiogenic causes -INCREASE lasix to 80 mg BID -reports 54 pound weight gain in 2 months; was having exercise intolerance for several months      Relevant Medications   furosemide (LASIX) 80 MG tablet   Other Relevant Orders   Ambulatory referral to Cardiology   DG Chest 2 View   Brain natriuretic peptide   Tachycardia    -likely from fluid overload -has orthopnea -EKG shows NSR with rate 100 and possible left atrial enlargement and rightward axis and prolonged QT      Relevant Medications   furosemide (LASIX) 80 MG tablet   Other Relevant Orders   Ambulatory referral to Cardiology   DG Chest 2 View   Brain natriuretic peptide      Outpatient Encounter Medications as of 12/27/2020  Medication Sig  . albuterol (VENTOLIN HFA) 108 (90 Base) MCG/ACT inhaler Inhale 1-2 puffs into the lungs every 6 (six) hours as needed for wheezing or shortness of breath.  . furosemide (LASIX) 80 MG tablet Take 1 tablet (80 mg total) by mouth  2 (two) times daily.  . [DISCONTINUED] furosemide (LASIX) 40 MG tablet Take 40 mg by mouth daily.  . [DISCONTINUED] azithromycin (ZITHROMAX) 250 MG tablet Take 1 tablet (250 mg total) by mouth daily. Take first  2 tablets together, then 1 every day until finished.  . [DISCONTINUED] cephALEXin (KEFLEX) 500 MG capsule Take 500 mg by mouth 2 (two) times daily.  . [DISCONTINUED] hydrochlorothiazide (HYDRODIURIL) 25 MG tablet Take 1 tablet (25 mg total) by mouth daily. (Patient not taking: Reported on 04/19/2018)  . [DISCONTINUED] losartan-hydrochlorothiazide (HYZAAR) 100-12.5 MG tablet Take 1 tablet by mouth daily.  . [DISCONTINUED] metFORMIN (GLUCOPHAGE) 500 MG tablet Take 1 tablet (500 mg total) by mouth 2 (two) times daily with a meal. (Patient not taking: Reported on 02/09/2016)  . [DISCONTINUED] predniSONE (DELTASONE) 20 MG tablet Take 2 tablets (40 mg total) by mouth daily. (Patient not taking: Reported on 12/27/2020)   No facility-administered encounter medications on file as of 12/27/2020.    Follow-up: Return in about 1 week (around 01/03/2021) for Physical Exam.   Noreene Larsson, NP

## 2020-12-27 NOTE — Assessment & Plan Note (Addendum)
-  unsure of etiology -pt states Creatinine was 1.10 when she had labs drawn at [facility] in Jan 2022, so unless there is a drastic change, renal function has been ok; rechecking labs today -has morbid obesity, but no obvious signs of liver failure -concerned for cardiogenic causes -INCREASE lasix to 80 mg BID -reports 54 pound weight gain in 2 months; was having exercise intolerance for several months

## 2020-12-27 NOTE — Assessment & Plan Note (Signed)
-  EKG today -urgent referral to cardiology -INCREASE lasix to 40 mg BID x 1 week

## 2020-12-27 NOTE — Assessment & Plan Note (Addendum)
-  likely from fluid overload -has orthopnea -EKG shows NSR with rate 100 and possible left atrial enlargement and rightward axis and prolonged QT

## 2020-12-27 NOTE — Assessment & Plan Note (Signed)
-  will check HIV and HCV with next set of labs 

## 2020-12-27 NOTE — Assessment & Plan Note (Signed)
  BMI Readings from Last 3 Encounters:  12/27/20 54.39 kg/m  04/19/18 50.21 kg/m  04/11/15 49.44 kg/m  -was followed by bariatrics at [facility] with last visit on record 2017

## 2020-12-28 ENCOUNTER — Emergency Department (HOSPITAL_COMMUNITY): Payer: No Typology Code available for payment source

## 2020-12-28 ENCOUNTER — Other Ambulatory Visit: Payer: Self-pay

## 2020-12-28 ENCOUNTER — Inpatient Hospital Stay (HOSPITAL_COMMUNITY)
Admission: EM | Admit: 2020-12-28 | Discharge: 2021-01-03 | DRG: 286 | Disposition: A | Discharge status: Home / Self Care | Payer: No Typology Code available for payment source | Attending: Internal Medicine | Admitting: Internal Medicine

## 2020-12-28 DIAGNOSIS — I10 Essential (primary) hypertension: Secondary | ICD-10-CM | POA: Diagnosis not present

## 2020-12-28 DIAGNOSIS — Z20822 Contact with and (suspected) exposure to covid-19: Secondary | ICD-10-CM | POA: Diagnosis present

## 2020-12-28 DIAGNOSIS — I5082 Biventricular heart failure: Secondary | ICD-10-CM | POA: Diagnosis present

## 2020-12-28 DIAGNOSIS — N179 Acute kidney failure, unspecified: Secondary | ICD-10-CM

## 2020-12-28 DIAGNOSIS — I428 Other cardiomyopathies: Secondary | ICD-10-CM | POA: Diagnosis present

## 2020-12-28 DIAGNOSIS — I5023 Acute on chronic systolic (congestive) heart failure: Secondary | ICD-10-CM | POA: Diagnosis present

## 2020-12-28 DIAGNOSIS — Z8249 Family history of ischemic heart disease and other diseases of the circulatory system: Secondary | ICD-10-CM | POA: Diagnosis not present

## 2020-12-28 DIAGNOSIS — E876 Hypokalemia: Secondary | ICD-10-CM | POA: Diagnosis not present

## 2020-12-28 DIAGNOSIS — I272 Pulmonary hypertension, unspecified: Secondary | ICD-10-CM

## 2020-12-28 DIAGNOSIS — I248 Other forms of acute ischemic heart disease: Secondary | ICD-10-CM | POA: Diagnosis present

## 2020-12-28 DIAGNOSIS — I110 Hypertensive heart disease with heart failure: Principal | ICD-10-CM | POA: Diagnosis present

## 2020-12-28 DIAGNOSIS — Z79899 Other long term (current) drug therapy: Secondary | ICD-10-CM | POA: Diagnosis not present

## 2020-12-28 DIAGNOSIS — E119 Type 2 diabetes mellitus without complications: Secondary | ICD-10-CM | POA: Diagnosis present

## 2020-12-28 DIAGNOSIS — R0602 Shortness of breath: Secondary | ICD-10-CM | POA: Diagnosis present

## 2020-12-28 DIAGNOSIS — I509 Heart failure, unspecified: Secondary | ICD-10-CM | POA: Diagnosis not present

## 2020-12-28 DIAGNOSIS — Z6841 Body Mass Index (BMI) 40.0 and over, adult: Secondary | ICD-10-CM | POA: Diagnosis not present

## 2020-12-28 DIAGNOSIS — IMO0002 Reserved for concepts with insufficient information to code with codable children: Secondary | ICD-10-CM | POA: Diagnosis present

## 2020-12-28 DIAGNOSIS — Z809 Family history of malignant neoplasm, unspecified: Secondary | ICD-10-CM

## 2020-12-28 DIAGNOSIS — G4733 Obstructive sleep apnea (adult) (pediatric): Secondary | ICD-10-CM | POA: Diagnosis present

## 2020-12-28 DIAGNOSIS — Z833 Family history of diabetes mellitus: Secondary | ICD-10-CM | POA: Diagnosis not present

## 2020-12-28 DIAGNOSIS — E1165 Type 2 diabetes mellitus with hyperglycemia: Secondary | ICD-10-CM | POA: Diagnosis not present

## 2020-12-28 DIAGNOSIS — R7989 Other specified abnormal findings of blood chemistry: Secondary | ICD-10-CM | POA: Diagnosis not present

## 2020-12-28 DIAGNOSIS — I5021 Acute systolic (congestive) heart failure: Secondary | ICD-10-CM | POA: Diagnosis not present

## 2020-12-28 DIAGNOSIS — R778 Other specified abnormalities of plasma proteins: Secondary | ICD-10-CM

## 2020-12-28 DIAGNOSIS — R609 Edema, unspecified: Secondary | ICD-10-CM | POA: Diagnosis not present

## 2020-12-28 DIAGNOSIS — I5041 Acute combined systolic (congestive) and diastolic (congestive) heart failure: Secondary | ICD-10-CM | POA: Diagnosis not present

## 2020-12-28 LAB — HEPATITIS C ANTIBODY: Hep C Virus Ab: <0.1 s/co ratio (ref 0.0–0.9)

## 2020-12-28 LAB — CBC WITH DIFFERENTIAL/PLATELET
Abs Immature Granulocytes: 0.01 K/uL (ref 0.00–0.07)
Basophils Absolute: 0 10*3/uL (ref 0.0–0.2)
Basophils Absolute: 0.1 K/uL (ref 0.0–0.1)
Basophils Relative: 1 %
Basos: 1 %
Eosinophils Absolute: 0 K/uL (ref 0.0–0.5)
Eosinophils Relative: 1 %
HCT: 47 % — ABNORMAL HIGH (ref 36.0–46.0)
Hemoglobin: 14.9 g/dL (ref 12.0–15.0)
Immature Grans (Abs): 0 10*3/uL (ref 0.0–0.1)
Immature Granulocytes: 0 %
Lymphocytes Absolute: 1.3 10*3/uL (ref 0.7–3.1)
Lymphocytes Relative: 21 %
Lymphs Abs: 1.5 K/uL (ref 0.7–4.0)
MCH: 26.3 pg — ABNORMAL LOW (ref 26.6–33.0)
MCH: 26.7 pg (ref 26.0–34.0)
MCHC: 31.7 g/dL (ref 30.0–36.0)
MCV: 84.1 fL (ref 80.0–100.0)
Monocytes Absolute: 0.6 10*3/uL (ref 0.1–0.9)
Monocytes Absolute: 0.9 K/uL (ref 0.1–1.0)
Monocytes Relative: 12 %
Monocytes: 10 %
Neutro Abs: 4.6 K/uL (ref 1.7–7.7)
Neutrophils Absolute: 3.7 10*3/uL (ref 1.4–7.0)
Neutrophils Relative %: 65 %
Platelets: 427 K/uL — ABNORMAL HIGH (ref 150–400)
RBC: 5.43 x10E6/uL — ABNORMAL HIGH (ref 3.77–5.28)
RBC: 5.59 MIL/uL — ABNORMAL HIGH (ref 3.87–5.11)
RDW: 19.4 % — ABNORMAL HIGH (ref 11.5–15.5)
WBC: 5.7 10*3/uL (ref 3.4–10.8)
WBC: 7.1 K/uL (ref 4.0–10.5)
nRBC: 0 % (ref 0.0–0.2)

## 2020-12-28 LAB — I-STAT BETA HCG BLOOD, ED (MC, WL, AP ONLY): I-stat hCG, quantitative: <5 m[IU]/mL (ref <5)

## 2020-12-28 LAB — D-DIMER, QUANTITATIVE: D-Dimer, Quant: 6.27 ug/mL-FEU — ABNORMAL HIGH (ref 0.00–0.50)

## 2020-12-28 LAB — LIPID PANEL WITH LDL/HDL RATIO
HDL: 47 mg/dL (ref >39)
LDL/HDL Ratio: 2.3 ratio (ref 0.0–3.2)

## 2020-12-28 LAB — COMPREHENSIVE METABOLIC PANEL WITH GFR
ALT: 23 U/L (ref 0–44)
AST: 32 U/L (ref 15–41)
Albumin: 3.3 g/dL — ABNORMAL LOW (ref 3.5–5.0)
Alkaline Phosphatase: 111 U/L (ref 38–126)
Anion gap: 10 (ref 5–15)
BUN: 20 mg/dL (ref 6–20)
CO2: 27 mmol/L (ref 22–32)
Calcium: 9.1 mg/dL (ref 8.9–10.3)
Chloride: 103 mmol/L (ref 98–111)
Creatinine, Ser: 1.15 mg/dL — ABNORMAL HIGH (ref 0.44–1.00)
GFR, Estimated: 59 mL/min — ABNORMAL LOW
Glucose, Bld: 105 mg/dL — ABNORMAL HIGH (ref 70–99)
Potassium: 4 mmol/L (ref 3.5–5.1)
Sodium: 140 mmol/L (ref 135–145)
Total Bilirubin: 1.7 mg/dL — ABNORMAL HIGH (ref 0.3–1.2)
Total Protein: 6.8 g/dL (ref 6.5–8.1)

## 2020-12-28 LAB — CMP14+EGFR
Albumin/Globulin Ratio: 1.2 (ref 1.2–2.2)
Chloride: 101 mmol/L (ref 96–106)
Globulin, Total: 3.1 g/dL (ref 1.5–4.5)
Glucose: 111 mg/dL — ABNORMAL HIGH (ref 65–99)
Potassium: 4.2 mmol/L (ref 3.5–5.2)
Sodium: 141 mmol/L (ref 134–144)

## 2020-12-28 LAB — BRAIN NATRIURETIC PEPTIDE: B Natriuretic Peptide: 2539.8 pg/mL — ABNORMAL HIGH (ref 0.0–100.0)

## 2020-12-28 LAB — HEMOGLOBIN A1C: Est. average glucose Bld gHb Est-mCnc: 166 mg/dL

## 2020-12-28 LAB — RESP PANEL BY RT-PCR (FLU A&B, COVID) ARPGX2
Influenza A by PCR: NEGATIVE
Influenza B by PCR: NEGATIVE
SARS Coronavirus 2 by RT PCR: NEGATIVE

## 2020-12-28 LAB — MICROALBUMIN / CREATININE URINE RATIO

## 2020-12-28 LAB — TROPONIN I (HIGH SENSITIVITY): Troponin I (High Sensitivity): 26 ng/L — ABNORMAL HIGH (ref <18)

## 2020-12-28 IMAGING — CR DG CHEST 2V
2 series · 2 of 2 positions shown · non-contrast
Comparison: None.

CLINICAL DATA: Short of breath for 5 days

EXAM:
CHEST - 2 VIEW

[w chest pa]
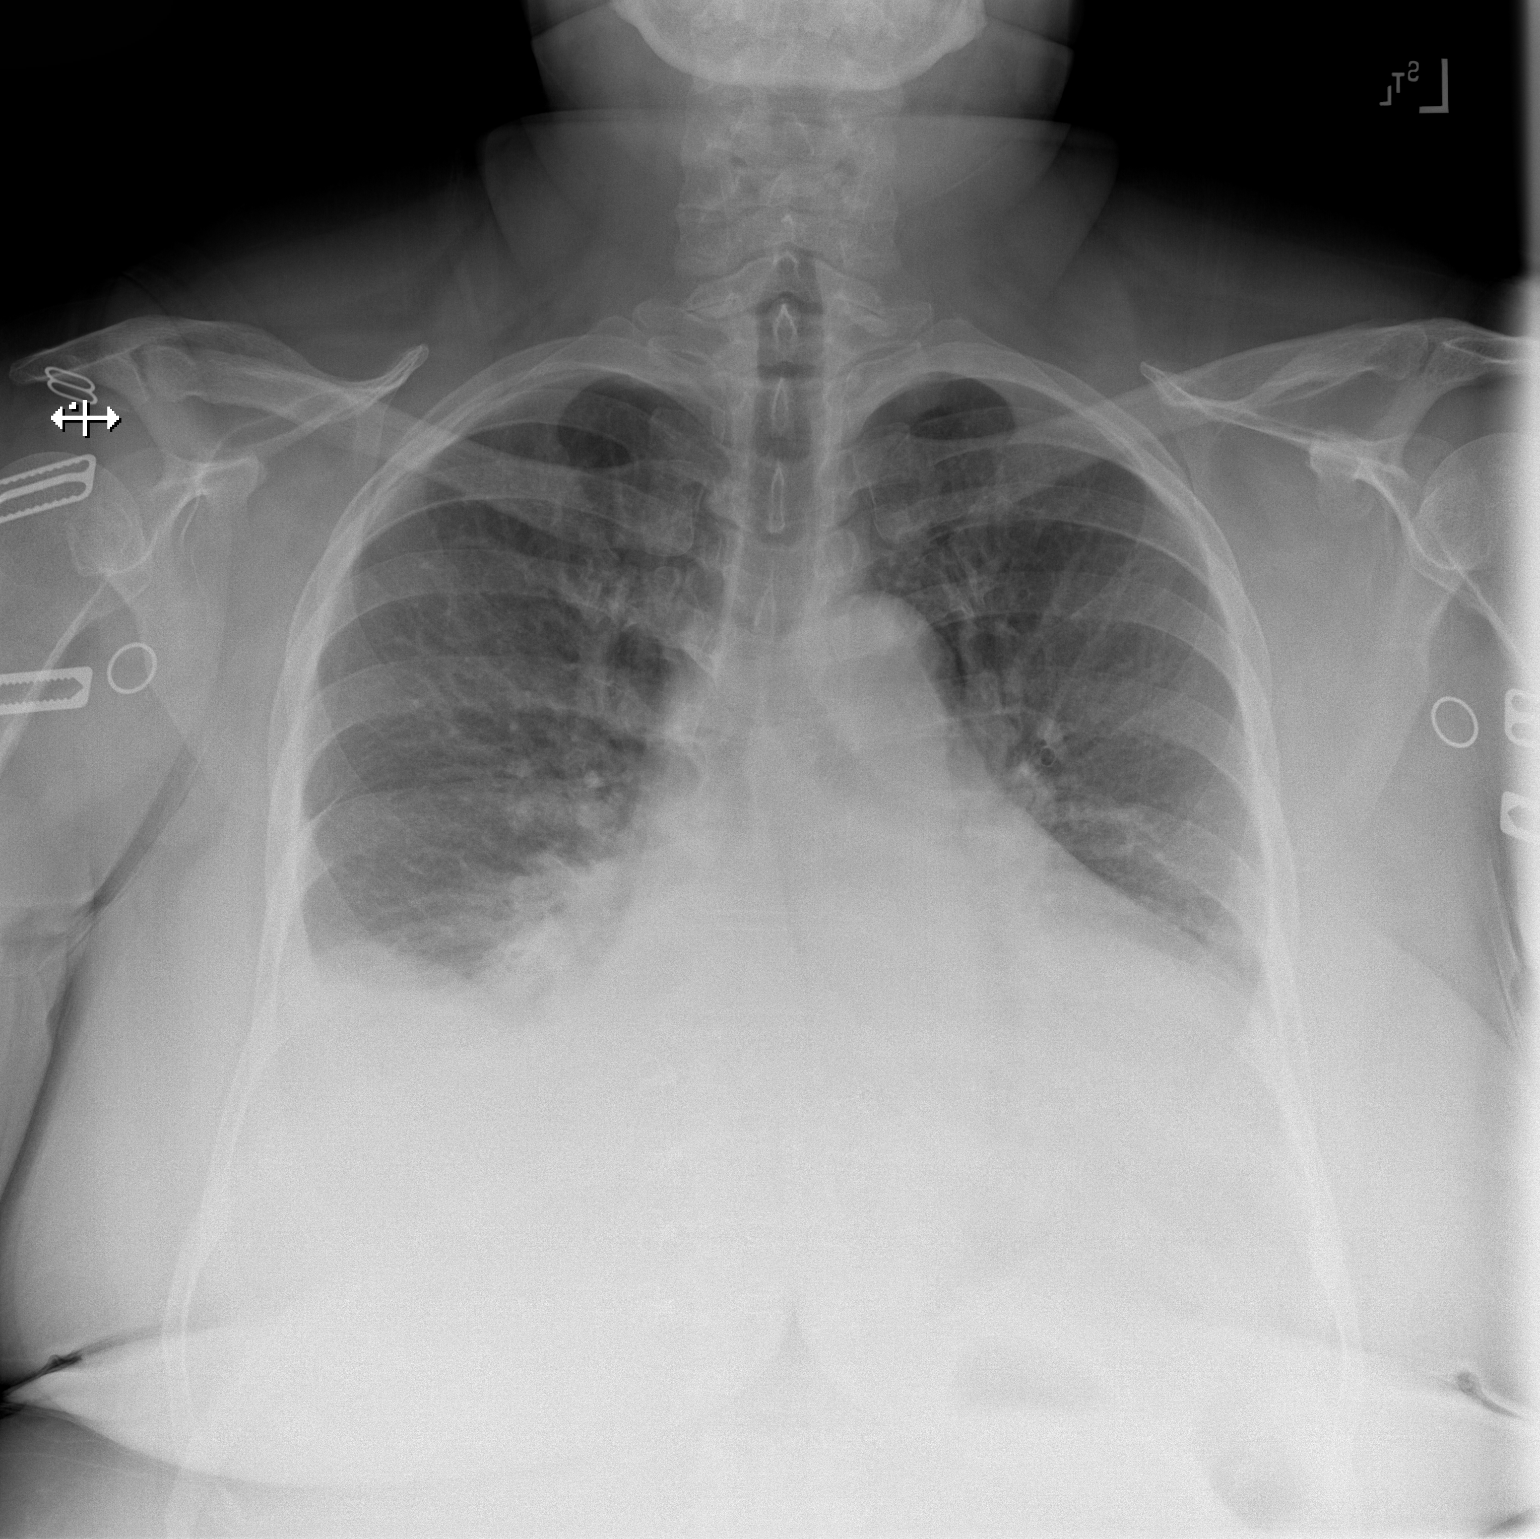

[w chest lat]
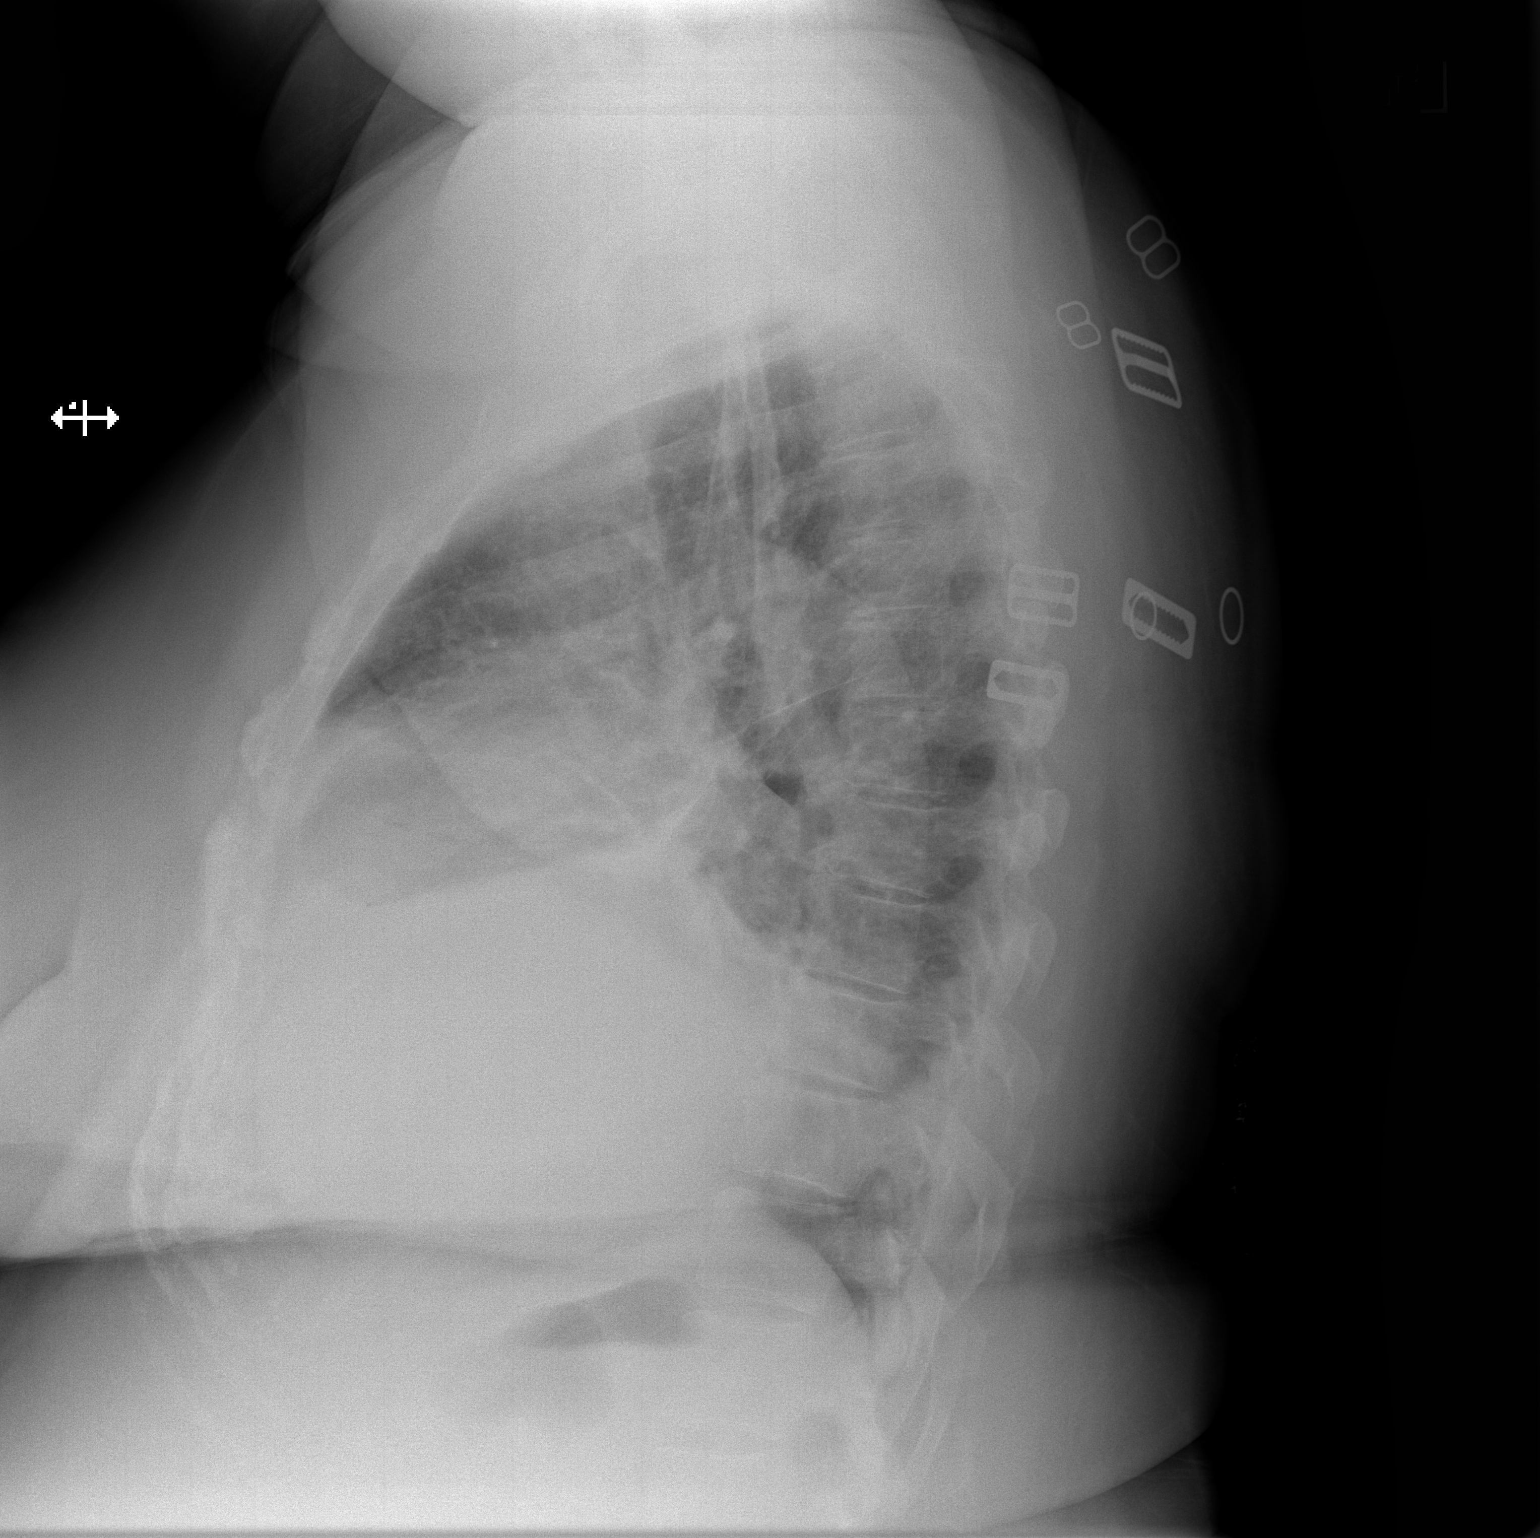

[2 of 2 positions shown; findings below may reference images not displayed]

FINDINGS: Frontal and lateral views of the chest demonstrate an enlarged
cardiac silhouette. There is increased central vascular congestion,
with patchy areas of bibasilar consolidation and small effusions,
greatest on the right. No pneumothorax. No acute bony abnormalities.
IMPRESSION: 1. Findings consistent with mild congestive heart failure.

## 2020-12-28 MED ORDER — HYDRALAZINE HCL 20 MG/ML IJ SOLN
5.0000 mg | INTRAMUSCULAR | Status: DC | PRN
  Administered 2020-12-29 – 2020-12-31 (×3): 5 mg via INTRAVENOUS
  Filled 2020-12-28 (×3): qty 1

## 2020-12-28 MED ORDER — HYDRALAZINE HCL 20 MG/ML IJ SOLN: 5.0000 mg | INTRAMUSCULAR | Status: DC | PRN

## 2020-12-28 MED ORDER — INSULIN ASPART 100 UNIT/ML IJ SOLN
0.0000 [IU] | Freq: Every day | INTRAMUSCULAR | Status: DC
  Filled 2020-12-28: qty 0.05

## 2020-12-28 MED ORDER — INSULIN ASPART 100 UNIT/ML IJ SOLN
0.0000 [IU] | Freq: Three times a day (TID) | INTRAMUSCULAR | Status: DC
  Administered 2020-12-29: 1 [IU] via SUBCUTANEOUS
  Filled 2020-12-28: qty 0.09

## 2020-12-28 MED ORDER — FUROSEMIDE 10 MG/ML IJ SOLN
80.0000 mg | Freq: Once | INTRAMUSCULAR | Status: AC
  Administered 2020-12-28: 80 mg via INTRAVENOUS
  Filled 2020-12-28: qty 8

## 2020-12-28 MED ORDER — FUROSEMIDE 10 MG/ML IJ SOLN
80.0000 mg | Freq: Two times a day (BID) | INTRAMUSCULAR | Status: DC
  Administered 2020-12-29 (×2): 80 mg via INTRAVENOUS
  Filled 2020-12-28 (×2): qty 8

## 2020-12-28 MED ORDER — ACETAMINOPHEN 325 MG PO TABS
650.0000 mg | ORAL_TABLET | ORAL | Status: DC | PRN
  Administered 2020-12-30 – 2021-01-02 (×4): 650 mg via ORAL
  Filled 2020-12-28 (×4): qty 2

## 2020-12-28 MED ORDER — SODIUM CHLORIDE 0.9% FLUSH
3.0000 mL | Freq: Two times a day (BID) | INTRAVENOUS | Status: DC
  Administered 2020-12-28 – 2021-01-01 (×6): 3 mL via INTRAVENOUS

## 2020-12-28 MED ORDER — HEPARIN BOLUS VIA INFUSION
5000.0000 [IU] | Freq: Once | INTRAVENOUS | Status: AC
  Administered 2020-12-29: 5000 [IU] via INTRAVENOUS
  Filled 2020-12-28: qty 5000

## 2020-12-28 MED ORDER — HEPARIN (PORCINE) 25000 UT/250ML-% IV SOLN
1600.0000 [IU]/h | INTRAVENOUS | Status: DC
  Administered 2020-12-29: 1600 [IU]/h via INTRAVENOUS
  Filled 2020-12-28: qty 250

## 2020-12-28 MED ORDER — SODIUM CHLORIDE 0.9% FLUSH
3.0000 mL | INTRAVENOUS | Status: DC | PRN
  Administered 2020-12-28: 3 mL via INTRAVENOUS

## 2020-12-28 MED ORDER — SODIUM CHLORIDE 0.9 % IV SOLN: 250.0000 mL | INTRAVENOUS | Status: DC | PRN

## 2020-12-28 NOTE — H&P (Signed)
History and Physical    Jacqueline Ward PPI:[id number] DOB: [birth year] DOA: 12/28/2020  PCP: Heather Roberts, NP Patient coming from: Home  Chief Complaint: Shortness of breath  HPI: Jacqueline Ward is a 45 y.o. female with medical history significant of type 2 diabetes, hypertension, morbid obesity (BMI 54.88) presented to primary care office yesterday with complaints of dyspnea on exertion, abdominal distention, lower extremity edema, and weight gain despite taking Lasix 40 mg twice daily for the past 3 weeks.  She was referred to cardiology and dose of her home Lasix was increased to 80 mg twice daily.  Patient continued to be symptomatic and presented to the ED today for further evaluation. In the ED, she was hypertensive and tachycardic.  Not hypoxic.  She appeared volume overloaded on exam.  Labs showing WBC 7.1, hemoglobin 14.9, platelet count 427K.  Sodium 140, potassium 4.0, chloride 103, bicarb 27, BUN 20, creatinine 1.1 (baseline 0.7), glucose 105.  T bili 1.7, remainder of LFTs normal.  BNP significantly elevated at 2539.  Initial high-sensitivity troponin mildly elevated at 26, repeat pending.  EKG without acute ischemic changes.  COVID and influenza PCR negative.  D-dimer elevated at 6.27.  Beta hCG negative.  Chest x-ray showing findings consistent with mild CHF. Patient was given IV Lasix 80 mg.  Patient reports 3 to 4-week history of bilateral lower extremity edema and abdominal distention.  She initially had dyspnea on exertion but it has now progressed to dyspnea even at rest.  She has gained a significant amount of weight.  Denies chest pain.  She was started on Lasix 40 mg twice daily by her PCP 3 weeks ago with no improvement of her symptoms.  Yesterday PCP increased dose of Lasix to 80 mg twice daily and she has taken 3 doses so far but still symptomatic.  Denies history of heart failure.  Denies history of blood clots.  Reports history of uncontrolled hypertension despite being on  multiple antihypertensives in the past.  She is currently only on Lasix.  Patient is a pediatrician.  Review of Systems:  All systems reviewed and apart from history of presenting illness, are negative.  Past Medical History:  Diagnosis Date  . Diabetes mellitus without complication (HCC)   . Hypertension   . Obese   . Sialolithiasis of submandibular gland 2019    Past Surgical History:  Procedure Laterality Date  . LIPECTOMY  2004     reports that she has never smoked. She has never used smokeless tobacco. She reports that she does not drink alcohol and does not use drugs.  No Known Allergies  Family History  Problem Relation Age of Onset  . Cancer Mother   . Hypertension Mother   . Pancreatitis Father   . Diabetes Father     Prior to Admission medications   Medication Sig Start Date End Date Taking? Authorizing Provider  albuterol (VENTOLIN HFA) 108 (90 Base) MCG/ACT inhaler Inhale 1-2 puffs into the lungs every 6 (six) hours as needed for wheezing or shortness of breath. 10/23/20   Mardella Layman, MD  furosemide (LASIX) 80 MG tablet Take 1 tablet (80 mg total) by mouth 2 (two) times daily. 12/27/20   Heather Roberts, NP    Physical Exam: Vitals:   12/28/20 1933 12/28/20 1945 12/28/20 2030 12/28/20 2146  BP: (!) 163/138 (!) 165/140 (!) 176/130 (!) 157/122  Pulse: (!) 118 (!) 110 (!) 111 (!) 108  Resp: 20  19 (!) 22  Temp: 98.7  F (37.1 C)     TempSrc: Oral     SpO2: 100% 99% 96% 96%  Weight: (!) 154.2 kg     Height: 5\' 6"  (1.676 m)       Physical Exam Constitutional:      General: She is not in acute distress. HENT:     Head: Normocephalic and atraumatic.  Eyes:     Extraocular Movements: Extraocular movements intact.     Conjunctiva/sclera: Conjunctivae normal.  Cardiovascular:     Rate and Rhythm: Regular rhythm. Tachycardia present.     Pulses: Normal pulses.     Comments: Slightly tachycardic Pulmonary:     Breath sounds: Rales present. No wheezing.      Comments: Slightly tachypneic Abdominal:     General: Bowel sounds are normal. There is distension.     Palpations: Abdomen is soft.     Tenderness: There is no abdominal tenderness. There is no guarding.  Musculoskeletal:     Cervical back: Normal range of motion.     Right lower leg: Edema present.     Left lower leg: Edema present.     Comments: +4 pedal edema bilaterally  Skin:    General: Skin is warm and dry.  Neurological:     General: No focal deficit present.     Mental Status: She is alert and oriented to person, place, and time.     Labs on Admission: I have personally reviewed following labs and imaging studies  CBC: Recent Labs  Lab 12/27/20 0908 12/28/20 2056  WBC 5.7 7.1  NEUTROABS 3.7 4.6  HGB 14.3 14.9  HCT 45.1 47.0*  MCV 83 84.1  PLT 414 427*   Basic Metabolic Panel: Recent Labs  Lab 12/27/20 0908 12/28/20 2056  NA 141 140  K 4.2 4.0  CL 101 103  CO2 24 27  GLUCOSE 111* 105*  BUN 19 20  CREATININE 1.12* 1.15*  CALCIUM 9.4 9.1   GFR: Estimated Creatinine Clearance: 92.9 mL/min (A) (by C-G formula based on SCr of 1.15 mg/dL (H)). Liver Function Tests: Recent Labs  Lab 12/27/20 0908 12/28/20 2056  AST 24 32  ALT 21 23  ALKPHOS 144* 111  BILITOT 1.3* 1.7*  PROT 6.9 6.8  ALBUMIN 3.8 3.3*   No results for input(s): LIPASE, AMYLASE in the last 168 hours. No results for input(s): AMMONIA in the last 168 hours. Coagulation Profile: No results for input(s): INR, PROTIME in the last 168 hours. Cardiac Enzymes: No results for input(s): CKTOTAL, CKMB, CKMBINDEX, TROPONINI in the last 168 hours. BNP (last 3 results) No results for input(s): PROBNP in the last 8760 hours. HbA1C: Recent Labs    12/27/20 0908  HGBA1C 7.4*   CBG: No results for input(s): GLUCAP in the last 168 hours. Lipid Profile: Recent Labs    12/27/20 0908  CHOL 168  HDL 47  LDLCALC 107*  TRIG 73   Thyroid Function Tests: No results for input(s): TSH,  T4TOTAL, FREET4, T3FREE, THYROIDAB in the last 72 hours. Anemia Panel: No results for input(s): VITAMINB12, FOLATE, FERRITIN, TIBC, IRON, RETICCTPCT in the last 72 hours. Urine analysis:    Component Value Date/Time   COLORURINE STRAW (A) 04/19/2018 1202   APPEARANCEUR CLEAR 04/19/2018 1202   LABSPEC 1.015 04/19/2018 1202   PHURINE 6.0 04/19/2018 1202   GLUCOSEU NEGATIVE 04/19/2018 1202   HGBUR NEGATIVE 04/19/2018 1202   BILIRUBINUR NEGATIVE 04/19/2018 1202   KETONESUR NEGATIVE 04/19/2018 1202   PROTEINUR NEGATIVE 04/19/2018 1202   NITRITE  NEGATIVE 04/19/2018 1202   LEUKOCYTESUR NEGATIVE 04/19/2018 1202    Radiological Exams on Admission: DG Chest 2 View  Result Date: 12/28/2020 CLINICAL DATA:  Short of breath for 5 days EXAM: CHEST - 2 VIEW COMPARISON:  None. FINDINGS: Frontal and lateral views of the chest demonstrate an enlarged cardiac silhouette. There is increased central vascular congestion, with patchy areas of bibasilar consolidation and small effusions, greatest on the right. No pneumothorax. No acute bony abnormalities. IMPRESSION: 1. Findings consistent with mild congestive heart failure. Electronically Signed   By: Sharlet Salina M.D.   On: 12/28/2020 20:00    EKG: Independently reviewed.  Sinus tachycardia, borderline T wave abnormalities.  No significant change since prior tracing.  Assessment/Plan Principal Problem:   Acute congestive heart failure (HCC) Active Problems:   Elevated d-dimer   AKI (acute kidney injury) (HCC)   Elevated troponin   Uncontrolled hypertension   Suspected new onset CHF Patient is here for evaluation of 3 to 4-week history of progressively worsening bilateral lower extremity edema, abdominal distention, dyspnea, and weight gain despite taking Lasix 40 mg twice daily x3 weeks.  Dose of Lasix was increased to 80 mg twice daily yesterday with no improvement of her symptoms.  Appears volume overloaded on exam.  No documented history of CHF.   BNP significantly elevated at 2539.  Chest x-ray showing findings consistent with mild CHF.  Not hypoxic but she does become slightly dyspneic and tachypneic with minimal movement in the bed. -Continue diuresis with IV Lasix 80 mg twice daily.  Echocardiogram ordered.  Monitor intake and output, daily weights.  Low-sodium diet with fluid restriction.  Continuous pulse ox, supplemental oxygen if needed.  I have sent a message to cardiology requesting consultation in the morning.  Elevated D-dimer -Suspicion for PE is low given volume overload, significantly elevated BNP, and chest x-ray findings consistent with CHF.  However, given risk factor for VTE (morbid obesity) and tachycardia, IV heparin has been started until VTE is ruled out.  Unable to obtain CT angiogram due to current contrast shortage.  VQ scan and bilateral lower extremity Dopplers have been ordered.  AKI Likely cardiorenal from new onset heart failure.  BUN 20, creatinine 1.1 (baseline 0.7). -Continue Lasix for diuresis and monitor renal function closely.  Avoid any other nephrotoxic agents.  Elevated troponin Likely due to demand ischemia from new onset heart failure.  High-sensitivity troponin mildly elevated at 26 and EKG without acute ischemic changes.  Patient denies chest pain. -Cardiac monitoring, trend troponin  Uncontrolled hypertension -Continue IV Lasix.  IV hydralazine prn.   Mild hyperbilirubinemia T bili 1.7, remainder of LFTs normal. -Check fractionated bilirubin levels  Type 2 diabetes A1c 7.4 on labs done yesterday. -Sliding scale insulin sensitive ACHS.  DVT prophylaxis: Heparin Code Status: Full code Family Communication: Sister at bedside. Disposition Plan: Status is: Inpatient  Remains inpatient appropriate because:Ongoing diagnostic testing needed not appropriate for outpatient work up and Inpatient level of care appropriate due to severity of illness   Dispo: The patient is from: Home               Anticipated d/c is to: Home              Patient currently is not medically stable to d/c.   Difficult to place patient No  Level of care: Level of care: Telemetry   The medical decision making on this patient was of high complexity and the patient is at high risk for clinical deterioration,  therefore this is a level 3 visit.  John Giovanni MD [facility]  If 7PM-7AM, please contact night-coverage [email or web address]  12/28/2020, 11:43 PM

## 2020-12-28 NOTE — ED Notes (Signed)
Attempted IV and got blood. Unsuccessful Iv.

## 2020-12-28 NOTE — ED Provider Notes (Signed)
Jacqueline Ward Provider Note   CSN: [id number] Arrival date & time: 12/28/20  1907     History Chief Complaint  Patient presents with  . Shortness of Breath    Jacqueline Ward is a 45 y.o. female.  HPI 45 year old female with a history of obesity, DM type II, hypertension presents to the ER with complaints of shortness of breath x1 week.  Patient was seen by an NP yesterday and started on 80 mg of Lasix.  She reports worsening lower extremity edema, with progression up to her thighs and abdomen.  No prior history of heart failure.  Patient reports 40 pound weight gain in the last 2 months.  She has a history of morbid obesity and was once followed by bariatrics at [facility].  Patient is a pediatric physician with .  She denies any cough, chest pain, shortness of breath.  No recent travel.  Not on OCPs.    Past Medical History:  Diagnosis Date  . Diabetes mellitus without complication (HCC)   . Hypertension   . Obese   . Sialolithiasis of submandibular gland 2019    Patient Active Problem List   Diagnosis Date Noted  . Acute congestive heart failure (HCC) 12/28/2020  . Encounter to establish care 12/27/2020  . Screening due 12/27/2020  . Type II diabetes mellitus, uncontrolled (HCC) 12/27/2020  . Exertional dyspnea 12/27/2020  . Generalized edema due to fluid overload 12/27/2020  . Tachycardia 12/27/2020  . Morbid obesity (HCC) 04/11/2015    Past Surgical History:  Procedure Laterality Date  . LIPECTOMY  2004     OB History    Gravida  0   Para  0   Term  0   Preterm  0   AB  0   Living  0     SAB  0   IAB  0   Ectopic  0   Multiple  0   Live Births              Family History  Problem Relation Age of Onset  . Cancer Mother   . Hypertension Mother   . Pancreatitis Father   . Diabetes Father     Social History   Tobacco Use  . Smoking status: Never Smoker  . Smokeless tobacco: Never Used  Vaping  Use  . Vaping Use: Never used  Substance Use Topics  . Alcohol use: No    Alcohol/week: 0.0 standard drinks  . Drug use: No    Home Medications Prior to Admission medications   Medication Sig Start Date End Date Taking? Authorizing Provider  albuterol (VENTOLIN HFA) 108 (90 Base) MCG/ACT inhaler Inhale 1-2 puffs into the lungs every 6 (six) hours as needed for wheezing or shortness of breath. 10/23/20   Mardella Layman, MD  furosemide (LASIX) 80 MG tablet Take 1 tablet (80 mg total) by mouth 2 (two) times daily. 12/27/20   Heather Roberts, NP    Allergies    Patient has no known allergies.  Review of Systems   Review of Systems  Constitutional: Negative for chills and fever.  HENT: Negative for ear pain and sore throat.   Eyes: Negative for pain and visual disturbance.  Respiratory: Positive for shortness of breath. Negative for cough.   Cardiovascular: Positive for leg swelling. Negative for chest pain and palpitations.  Gastrointestinal: Negative for abdominal pain and vomiting.  Genitourinary: Negative for dysuria and hematuria.  Musculoskeletal: Negative for arthralgias and back pain.  Skin: Negative for color change and rash.  Neurological: Negative for seizures and syncope.  All other systems reviewed and are negative.   Physical Exam Updated Vital Signs BP (!) 157/122 (BP Location: Right Wrist)   Pulse (!) 108   Temp 98.7 F (37.1 C) (Oral)   Resp (!) 22   Ht 5\' 6"  (1.676 m)   Wt (!) 154.2 kg   SpO2 96%   BMI 54.88 kg/m   Physical Exam Vitals and nursing note reviewed.  Constitutional:      General: She is not in acute distress.    Appearance: She is well-developed.  HENT:     Head: Normocephalic and atraumatic.  Eyes:     Conjunctiva/sclera: Conjunctivae normal.  Cardiovascular:     Rate and Rhythm: Normal rate and regular rhythm.     Heart sounds: No murmur heard.   Pulmonary:     Effort: Pulmonary effort is normal. No respiratory distress.     Breath  sounds: Normal breath sounds. No decreased breath sounds, wheezing, rhonchi or rales.  Abdominal:     Palpations: Abdomen is soft.     Tenderness: There is no abdominal tenderness.  Musculoskeletal:     Cervical back: Neck supple.     Right lower leg: No tenderness. Edema present.     Left lower leg: Edema present.     Comments: 2+ lower extremity edema bilaterally.  No overlying warmth, erythema.  Skin:    General: Skin is warm and dry.  Neurological:     General: No focal deficit present.     Mental Status: She is alert.  Psychiatric:        Mood and Affect: Mood normal.        Behavior: Behavior normal.     ED Results / Procedures / Treatments   Labs (all labs ordered are listed, but only abnormal results are displayed) Labs Reviewed  CBC WITH DIFFERENTIAL/PLATELET - Abnormal; Notable for the following components:      Result Value   RBC 5.59 (*)    HCT 47.0 (*)    RDW 19.4 (*)    Platelets 427 (*)    All other components within normal limits  COMPREHENSIVE METABOLIC PANEL - Abnormal; Notable for the following components:   Glucose, Bld 105 (*)    Creatinine, Ser 1.15 (*)    Albumin 3.3 (*)    Total Bilirubin 1.7 (*)    GFR, Estimated 59 (*)    All other components within normal limits  BRAIN NATRIURETIC PEPTIDE - Abnormal; Notable for the following components:   B Natriuretic Peptide 2,539.8 (*)    All other components within normal limits  D-DIMER, QUANTITATIVE - Abnormal; Notable for the following components:   D-Dimer, Quant 6.27 (*)    All other components within normal limits  TROPONIN I (HIGH SENSITIVITY) - Abnormal; Notable for the following components:   Troponin I (High Sensitivity) 26 (*)    All other components within normal limits  RESP PANEL BY RT-PCR (FLU A&B, COVID) ARPGX2  I-STAT BETA HCG BLOOD, ED ([facility], [facility], [facility] ONLY)  TROPONIN I (HIGH SENSITIVITY)    EKG EKG Interpretation  Date/Time:  Saturday Dec 28 2020 20:15:27 EDT Ventricular Rate:   112 PR Interval:  153 QRS Duration: 105 QT Interval:  339 QTC Calculation: 463 R Axis:   107 Text Interpretation: Sinus tachycardia Probable left atrial enlargement Right axis deviation Borderline T wave abnormalities:Diffuse T wave flattening in most leads.  No acute ischemia. Confirmed  by Pieter Partridge [phone] on 12/28/2020 8:23:34 PM   Radiology DG Chest 2 View  Result Date: 12/28/2020 CLINICAL DATA:  Short of breath for 5 days EXAM: CHEST - 2 VIEW COMPARISON:  None. FINDINGS: Frontal and lateral views of the chest demonstrate an enlarged cardiac silhouette. There is increased central vascular congestion, with patchy areas of bibasilar consolidation and small effusions, greatest on the right. No pneumothorax. No acute bony abnormalities. IMPRESSION: 1. Findings consistent with mild congestive heart failure. Electronically Signed   By: Sharlet Salina M.D.   On: 12/28/2020 20:00    Procedures Procedures   Medications Ordered in ED Medications  furosemide (LASIX) injection 80 mg (has no administration in time range)    ED Course  I have reviewed the triage vital signs and the nursing notes.  Pertinent labs & imaging results that were available during my care of the patient were reviewed by me and considered in my medical decision making (see chart for details).    MDM Rules/Calculators/A&P                         45 year old female presents to the ER with progressive shortness of breath over the last week.  On arrival, she is well-appearing, no acute respiratory distress.  She is hypertensive on arrival with a blood pressure 163/38, tachycardic between 110 and 120.  No evidence of wheezes, rales, rhonchi.  Speaking full sentences without increased work of breathing.  She does have 2+ pitting edema to lower extremities bilaterally and does appear to be fluid overloaded.  Labs and imaging ordered, reviewed and interpreted by me.  CMP with a mildly elevated creatinine 1.15.  CBC without  leukocytosis.  COVID and flu are negative.  Given tachycardia in the setting of shortness of breath, D-dimer was ordered.  This was 6.27.  BNP of 2539.8.  Initial troponin of 26.  Chest x-ray consistent with mild heart failure.  EKG with sinus tach.  Patient's presentation likely consistent with new onset heart failure.  Patient given 80 mg of IV Lasix, will need admission for further diuresis, echo and possible VQ scan though suspicion for PE is low at this time.  Consulted hospitalist team for admission  As per discussion with the hospitalist, will initiate heparin therapy pending VQ scan.  Patient was informed of this, and the plan for admission.  She is agreeable.  She denies any history of heavy bleeding.  Stable for admission. Final Clinical Impression(s) / ED Diagnoses Final diagnoses:  New onset of congestive heart failure (HCC)  Elevated d-dimer    Rx / DC Orders ED Discharge Orders    None       Leone Brand 12/28/20 2230    Koleen Distance, MD 12/28/20 2332

## 2020-12-28 NOTE — ED Provider Notes (Signed)
Emergency Medicine Provider Triage Evaluation Note  Jacqueline Ward , a 45 y.o. female  was evaluated in triage.  Pt complains of ble edema for the last 3 weeks. No prior hx of chf. PCP started her on lasix 3 weeks ago at 40 bid. This was recently increased to 80 bid however sxs have not improved, and have actually worse. She now has swelling up to the mid thigh and abdomen. She reports worsening DOE. Denies chest pain, ha, dizziness, nv, abd.   Review of Systems  Positive: ble edema, doe Negative: Chest pain, ha, dizziness, nv, abd pain  Physical Exam  BP (!) 163/138 (BP Location: Right Wrist)   Pulse (!) 118   Temp 98.7 F (37.1 C) (Oral)   Resp 20   Ht 5\' 6"  (1.676 m)   Wt (!) 154.2 kg   SpO2 100%   BMI 54.88 kg/m  Gen:   Awake, no distress   Resp:  Normal effort  MSK:   Moves extremities without difficulty  Other:  tachypneic  Medical Decision Making  Medically screening exam initiated at 7:45 PM.  Appropriate orders placed.  was informed that the remainder of the evaluation will be completed by another provider, this initial triage assessment does not replace that evaluation, and the importance of remaining in the ED until their evaluation is complete.     Richrd Sox 12/28/20 2047    2048, MD 12/28/20 2154

## 2020-12-28 NOTE — Progress Notes (Signed)
ANTICOAGULATION CONSULT NOTE - Initial Consult  Pharmacy Consult for heparin Indication: PE  No Known Allergies  Patient Measurements: Height: 5\' 6"  (167.6 cm) Weight: (!) 154.2 kg (340 lb) IBW/kg (Calculated) : 59.3 Heparin Dosing Weight: 98kg  Vital Signs: Temp: 98.7 F (37.1 C) (05/28 1933) Temp Source: Oral (05/28 1933) BP: 157/122 (05/28 2146) Pulse Rate: 108 (05/28 2146)  Labs: Recent Labs    12/27/20 0908 12/28/20 2056  HGB 14.3 14.9  HCT 45.1 47.0*  PLT 414 427*  CREATININE 1.12* 1.15*  TROPONINIHS  --  26*    Estimated Creatinine Clearance: 92.9 mL/min (A) (by C-G formula based on SCr of 1.15 mg/dL (H)).   Medical History: Past Medical History:  Diagnosis Date  . Diabetes mellitus without complication (HCC)   . Hypertension   . Obese   . Sialolithiasis of submandibular gland 2019    Assessment: 45 year old female with a history of obesity, DM type II, hypertension presents to the ER with complaints of shortness of breath x1 week. Heparin per Rx for presumed PE.  No prior AC noted  12/28/2020  Hgb 14.9, plts 427 DDimer 6.27 Scr 1.15  Goal of Therapy:  Heparin level 0.3-0.7 units/ml Monitor platelets by anticoagulation protocol   Plan:  Heparin bolus 5000 units x 1 Start heparin drip at 1600 units/hr Heparin level in 6 hours Daily CBC F/u results of VQ scan   12/30/2020 RPh 12/28/2020, 10:40 PM

## 2020-12-28 NOTE — ED Triage Notes (Signed)
Pt came in with c/o SOB times 5 days. She was recently placed on 80mg  Lasix BID. She has been taking Lasix for three weeks.

## 2020-12-28 NOTE — ED Notes (Signed)
Patient transported to X-ray 

## 2020-12-29 ENCOUNTER — Encounter (HOSPITAL_COMMUNITY): Payer: Self-pay | Admitting: Internal Medicine

## 2020-12-29 ENCOUNTER — Inpatient Hospital Stay (HOSPITAL_COMMUNITY): Payer: No Typology Code available for payment source

## 2020-12-29 DIAGNOSIS — I5041 Acute combined systolic (congestive) and diastolic (congestive) heart failure: Secondary | ICD-10-CM

## 2020-12-29 DIAGNOSIS — I509 Heart failure, unspecified: Secondary | ICD-10-CM | POA: Diagnosis not present

## 2020-12-29 DIAGNOSIS — R7989 Other specified abnormal findings of blood chemistry: Secondary | ICD-10-CM

## 2020-12-29 DIAGNOSIS — R778 Other specified abnormalities of plasma proteins: Secondary | ICD-10-CM

## 2020-12-29 DIAGNOSIS — N179 Acute kidney failure, unspecified: Secondary | ICD-10-CM

## 2020-12-29 LAB — CMP14+EGFR
ALT: 21 IU/L (ref 0–32)
AST: 24 IU/L (ref 0–40)
Albumin: 3.8 g/dL (ref 3.8–4.8)
Alkaline Phosphatase: 144 IU/L — ABNORMAL HIGH (ref 44–121)
BUN/Creatinine Ratio: 17 (ref 9–23)
BUN: 19 mg/dL (ref 6–24)
Bilirubin Total: 1.3 mg/dL — ABNORMAL HIGH (ref 0.0–1.2)
CO2: 24 mmol/L (ref 20–29)
Calcium: 9.4 mg/dL (ref 8.7–10.2)
Creatinine, Ser: 1.12 mg/dL — ABNORMAL HIGH (ref 0.57–1.00)
Total Protein: 6.9 g/dL (ref 6.0–8.5)
eGFR: 61 mL/min/{1.73_m2} (ref >59)

## 2020-12-29 LAB — CBC WITH DIFFERENTIAL/PLATELET
EOS (ABSOLUTE): 0.1 10*3/uL (ref 0.0–0.4)
Eos: 1 %
Hematocrit: 45.1 % (ref 34.0–46.6)
Hemoglobin: 14.3 g/dL (ref 11.1–15.9)
Immature Granulocytes: 0 %
Lymphs: 24 %
MCHC: 31.7 g/dL (ref 31.5–35.7)
MCV: 83 fL (ref 79–97)
Neutrophils: 64 %
Platelets: 414 10*3/uL (ref 150–450)
RDW: 18.1 % — ABNORMAL HIGH (ref 11.7–15.4)

## 2020-12-29 LAB — LIPID PANEL WITH LDL/HDL RATIO
Cholesterol, Total: 168 mg/dL (ref 100–199)
LDL Chol Calc (NIH): 107 mg/dL — ABNORMAL HIGH (ref 0–99)
Triglycerides: 73 mg/dL (ref 0–149)
VLDL Cholesterol Cal: 14 mg/dL (ref 5–40)

## 2020-12-29 LAB — BASIC METABOLIC PANEL
Anion gap: 10 (ref 5–15)
BUN: 15 mg/dL (ref 6–20)
CO2: 28 mmol/L (ref 22–32)
Calcium: 8.3 mg/dL — ABNORMAL LOW (ref 8.9–10.3)
Chloride: 102 mmol/L (ref 98–111)
Creatinine, Ser: 1.02 mg/dL — ABNORMAL HIGH (ref 0.44–1.00)
GFR, Estimated: >60 mL/min (ref >60)
Glucose, Bld: 101 mg/dL — ABNORMAL HIGH (ref 70–99)
Potassium: 3.3 mmol/L — ABNORMAL LOW (ref 3.5–5.1)
Sodium: 140 mmol/L (ref 135–145)

## 2020-12-29 LAB — GLUCOSE, CAPILLARY
Glucose-Capillary: 107 mg/dL — ABNORMAL HIGH (ref 70–99)
Glucose-Capillary: 130 mg/dL — ABNORMAL HIGH (ref 70–99)

## 2020-12-29 LAB — URINALYSIS, ROUTINE W REFLEX MICROSCOPIC
Bilirubin Urine: NEGATIVE
Glucose, UA: NEGATIVE mg/dL
Hgb urine dipstick: NEGATIVE
Ketones, ur: NEGATIVE mg/dL
Leukocytes,Ua: NEGATIVE
Nitrite: NEGATIVE
Protein, ur: NEGATIVE mg/dL
Specific Gravity, Urine: 1.005 (ref 1.005–1.030)
pH: 6 (ref 5.0–8.0)

## 2020-12-29 LAB — HIV ANTIBODY (ROUTINE TESTING W REFLEX): HIV Screen 4th Generation wRfx: NONREACTIVE

## 2020-12-29 LAB — MICROALBUMIN / CREATININE URINE RATIO
Microalb/Creat Ratio: 1099 mg/g creat — ABNORMAL HIGH (ref 0–29)
Microalbumin, Urine: 2293.9 ug/mL

## 2020-12-29 LAB — CBC
HCT: 42.9 % (ref 36.0–46.0)
Hemoglobin: 13.7 g/dL (ref 12.0–15.0)
MCH: 26.3 pg (ref 26.0–34.0)
MCHC: 31.9 g/dL (ref 30.0–36.0)
MCV: 82.3 fL (ref 80.0–100.0)
Platelets: 367 10*3/uL (ref 150–400)
RBC: 5.21 MIL/uL — ABNORMAL HIGH (ref 3.87–5.11)
RDW: 18.9 % — ABNORMAL HIGH (ref 11.5–15.5)
WBC: 6.6 10*3/uL (ref 4.0–10.5)
nRBC: 0 % (ref 0.0–0.2)

## 2020-12-29 LAB — APTT: aPTT: 30 seconds (ref 24–36)

## 2020-12-29 LAB — ECHOCARDIOGRAM COMPLETE
Area-P 1/2: 7.66 cm2
Height: 66 in
S' Lateral: 5.2 cm
Weight: 5440 oz

## 2020-12-29 LAB — PROTIME-INR
INR: 1.4 — ABNORMAL HIGH (ref 0.8–1.2)
Prothrombin Time: 17.1 seconds — ABNORMAL HIGH (ref 11.4–15.2)

## 2020-12-29 LAB — CBG MONITORING, ED
Glucose-Capillary: 130 mg/dL — ABNORMAL HIGH (ref 70–99)
Glucose-Capillary: 115 mg/dL — ABNORMAL HIGH (ref 70–99)
Glucose-Capillary: 126 mg/dL — ABNORMAL HIGH (ref 70–99)

## 2020-12-29 LAB — HEMOGLOBIN A1C: Hgb A1c MFr Bld: 7.4 % — ABNORMAL HIGH (ref 4.8–5.6)

## 2020-12-29 LAB — HEPARIN LEVEL (UNFRACTIONATED)
Heparin Unfractionated: <0.1 IU/mL — ABNORMAL LOW (ref 0.30–0.70)
Heparin Unfractionated: 0.49 IU/mL (ref 0.30–0.70)

## 2020-12-29 LAB — TROPONIN I (HIGH SENSITIVITY): Troponin I (High Sensitivity): 28 ng/L — ABNORMAL HIGH (ref <18)

## 2020-12-29 LAB — BILIRUBIN, FRACTIONATED(TOT/DIR/INDIR)
Bilirubin, Direct: 0.4 mg/dL — ABNORMAL HIGH (ref 0.0–0.2)
Indirect Bilirubin: 1.1 mg/dL — ABNORMAL HIGH (ref 0.3–0.9)
Total Bilirubin: 1.5 mg/dL — ABNORMAL HIGH (ref 0.3–1.2)

## 2020-12-29 LAB — BRAIN NATRIURETIC PEPTIDE: BNP: 3439.9 pg/mL — ABNORMAL HIGH (ref 0.0–100.0)

## 2020-12-29 MED ORDER — CARVEDILOL 3.125 MG PO TABS
6.2500 mg | ORAL_TABLET | Freq: Two times a day (BID) | ORAL | Status: DC
  Administered 2020-12-29: 6.25 mg via ORAL
  Filled 2020-12-29: qty 2

## 2020-12-29 MED ORDER — HEPARIN (PORCINE) 25000 UT/250ML-% IV SOLN
1750.0000 [IU]/h | INTRAVENOUS | Status: DC
  Administered 2020-12-29 – 2020-12-31 (×4): 1750 [IU]/h via INTRAVENOUS
  Filled 2020-12-29 (×4): qty 250

## 2020-12-29 MED ORDER — LOSARTAN POTASSIUM 25 MG PO TABS
25.0000 mg | ORAL_TABLET | Freq: Two times a day (BID) | ORAL | Status: DC
  Administered 2020-12-29: 25 mg via ORAL
  Filled 2020-12-29: qty 1

## 2020-12-29 MED ORDER — SACUBITRIL-VALSARTAN 49-51 MG PO TABS
1.0000 | ORAL_TABLET | Freq: Two times a day (BID) | ORAL | Status: DC
  Administered 2020-12-29 – 2020-12-30 (×4): 1 via ORAL
  Filled 2020-12-29 (×6): qty 1

## 2020-12-29 MED ORDER — SPIRONOLACTONE 12.5 MG HALF TABLET
12.5000 mg | ORAL_TABLET | Freq: Every day | ORAL | Status: DC
  Administered 2020-12-29: 12.5 mg via ORAL
  Filled 2020-12-29: qty 1

## 2020-12-29 MED ORDER — HEPARIN BOLUS VIA INFUSION
3000.0000 [IU] | INTRAVENOUS | Status: DC
  Filled 2020-12-29: qty 3000

## 2020-12-29 MED ORDER — ALUM & MAG HYDROXIDE-SIMETH 200-200-20 MG/5ML PO SUSP
15.0000 mL | Freq: Four times a day (QID) | ORAL | Status: DC | PRN
  Administered 2020-12-29 – 2021-01-02 (×3): 15 mL via ORAL
  Filled 2020-12-29 (×3): qty 30

## 2020-12-29 MED ORDER — DIGOXIN 125 MCG PO TABS
0.1250 mg | ORAL_TABLET | Freq: Every day | ORAL | Status: DC
  Administered 2020-12-29 – 2021-01-03 (×6): 0.125 mg via ORAL
  Filled 2020-12-29 (×6): qty 1

## 2020-12-29 MED ORDER — POTASSIUM CHLORIDE ER 10 MEQ PO TBCR
20.0000 meq | EXTENDED_RELEASE_TABLET | Freq: Two times a day (BID) | ORAL | Status: DC
  Filled 2020-12-29: qty 2

## 2020-12-29 MED ORDER — POTASSIUM CHLORIDE CRYS ER 20 MEQ PO TBCR
20.0000 meq | EXTENDED_RELEASE_TABLET | Freq: Every day | ORAL | Status: DC
  Administered 2020-12-29: 20 meq via ORAL

## 2020-12-29 NOTE — ED Notes (Signed)
Report given to [facility] 

## 2020-12-29 NOTE — Plan of Care (Signed)

## 2020-12-29 NOTE — ED Notes (Signed)
Breakfast tray provided. 

## 2020-12-29 NOTE — Progress Notes (Addendum)
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 -She was recently started on Lasix by her family doctor and referred to cardiology, due to worsening symptoms presented to the ED -In the ER, creatinine was 1.12, BNP was 3439, high-sensitivity troponin was 28, Urinalysis was negative for microalbuminuria. -Started on IV Lasix with good clinical response, echo this morning notes EF of 20-25%, global hypokinesis, mild LVH, grade 2 diastolic dysfunction, RV systolic function is moderately reduced, severe elevated PASP  Assessment & Plan:   Acute systolic CHF -Admitted with significant volume overload, anasarca -Echo : EF of 20-25%, global hypokinesis, mild LVH, grade 2 diastolic dysfunction, RV systolic function is moderately reduced, severe elevated PASP -Starting to improve with diuresis, continue IV Lasix 80 mg every 12,  -urine output not accurate in the ED -Suspect this might be nonischemic cardiomyopathy however will need ischemia work-up at some point once volume status has improved -Cardiology following-started Entresto, Aldactone, digoxin, will transfer to [facility], heart failure team to follow -Monitor urine output and kidney function closely, daily weights -Will need a sleep study after discharge  Elevated D-dimer -Clinical suspicion for PE is very low, she was started on IV heparin by admitting MD overnight -Lower extremity Dopplers pending, if this is negative would discontinue heparin, VQ scan was also ordered on admission  Elevated troponin -Minimally elevated high-sensitivity troponin, EKG without acute findings -Clinically no evidence of  ACS  Uncontrolled hypertension -Anticipate this will improve with diuresis, continue IV Lasix and hydralazine as needed  Type 2 diabetes mellitus -Hemoglobin A1c is 7.4, continue sensitive sliding scale  Obesity -Needs sleep study after discharge and continued weight loss  DVT prophylaxis: Heparin Code Status: Full code Family Communication: Discussed patient in detail, no family at bedside Disposition Plan:  Status is: Inpatient  Remains inpatient appropriate because:Inpatient level of care appropriate due to severity of illness   Dispo: The patient is from: Home              Anticipated d/c is to: Home              Patient currently is not medically stable to d/c.   Difficult to place patient No  Consultants:   Cardiology   Procedures:   Antimicrobials:    Subjective: -Feels much better this morning, breathing starting to improve, abdominal tightness is much better  Objective: Vitals:   12/29/20 0406 12/29/20 0700 12/29/20 0800 12/29/20 1030  BP: (!) 168/125 (!) 153/122 (!) 164/137 (!) 164/101  Pulse: 100 86 95 98  Resp: 19 (!) 24 (!) 27 (!) 24  Temp:      TempSrc:      SpO2: 100% 95% 100% 100%  Weight:      Height:        Intake/Output Summary (Last 24 hours) at 12/29/2020 1046 Last data filed at 12/29/2020 7017 Gross per 24 hour  Intake --  Output 1000 ml  Net -1000 ml   Filed Weights   12/28/20 1933  Weight: (!) 154.2 kg    Examination:  General exam: Obese pleasant female sitting up in bed, AAOx3, no distress CVS: S1-S2, regular rate rhythm Lungs: Decreased breath sounds to bases  Abdomen: Obese, soft, abdominal wall edema noted especially laterally Extremities: 2+ edema extending to upper thighs Skin: Dry skin in both lower extremities Psychiatry: Mood & affect appropriate.     Data Reviewed:   CBC: Recent Labs  Lab 12/27/20 0908 12/28/20 2056 12/29/20 0500  WBC 5.7 7.1 6.6  NEUTROABS 3.7 4.6  --   HGB 14.3 14.9 13.7  HCT 45.1  47.0* 42.9  MCV 83 84.1 82.3  PLT 414 427* 367   Basic Metabolic Panel: Recent Labs  Lab 12/27/20 0908 12/28/20 2056  NA 141 140  K 4.2 4.0  CL 101 103  CO2 24 27  GLUCOSE 111* 105*  BUN 19 20  CREATININE 1.12* 1.15*  CALCIUM 9.4 9.1   GFR: Estimated Creatinine Clearance: 92.9 mL/min (A) (by C-G formula based on SCr of 1.15 mg/dL (H)). Liver Function Tests: Recent Labs  Lab 12/27/20 0908 12/28/20 2056  AST 24 32  ALT 21 23  ALKPHOS 144* 111  BILITOT 1.3* 1.7*  PROT 6.9 6.8  ALBUMIN 3.8 3.3*   No results for input(s): LIPASE, AMYLASE in the last 168 hours. No results for input(s): AMMONIA in the last 168 hours. Coagulation Profile: Recent Labs  Lab 12/28/20 2057  INR 1.4*   Cardiac Enzymes: No results for input(s): CKTOTAL, CKMB, CKMBINDEX, TROPONINI in the last 168 hours. BNP (last 3 results) No results for input(s): PROBNP in the last 8760 hours. HbA1C: Recent Labs    12/27/20 0908  HGBA1C 7.4*   CBG: Recent Labs  Lab 12/29/20 0154 12/29/20 0750  GLUCAP 130* 115*   Lipid Profile: Recent Labs    12/27/20 0908  CHOL 168  HDL 47  LDLCALC 107*  TRIG 73   Thyroid Function Tests: No results for input(s): TSH, T4TOTAL, FREET4, T3FREE, THYROIDAB in the last 72 hours. Anemia Panel: No results for input(s): VITAMINB12, FOLATE, FERRITIN, TIBC, IRON, RETICCTPCT in the last 72 hours. Urine analysis:    Component Value Date/Time   COLORURINE STRAW (A) 12/29/2020 0808   APPEARANCEUR CLEAR 12/29/2020 0808   LABSPEC 1.005 12/29/2020 0808   PHURINE 6.0 12/29/2020 0808   GLUCOSEU NEGATIVE 12/29/2020 0808   HGBUR NEGATIVE 12/29/2020 0808   BILIRUBINUR NEGATIVE 12/29/2020 0808   KETONESUR NEGATIVE 12/29/2020 0808   PROTEINUR NEGATIVE 12/29/2020 0808   NITRITE NEGATIVE 12/29/2020 0808   LEUKOCYTESUR NEGATIVE 12/29/2020 0808   Sepsis Labs: @LABRCNTIP (procalcitonin:4,lacticidven:4)  ) Recent Results (from the past 240 hour(s))  Resp Panel by RT-PCR  (Flu A&B, Covid) Nasopharyngeal Swab     Status: None   Collection Time: 12/28/20  8:56 PM   Specimen: Nasopharyngeal Swab; Nasopharyngeal(NP) swabs in vial transport medium  Result Value Ref Range Status   SARS Coronavirus 2 by RT PCR NEGATIVE NEGATIVE Final    Comment: (NOTE) SARS-CoV-2 target nucleic acids are NOT DETECTED.  The SARS-CoV-2 RNA is generally detectable in upper respiratory specimens during the acute phase of infection. The lowest concentration of SARS-CoV-2 viral copies this assay can detect is 138 copies/mL. A negative result does not preclude SARS-Cov-2 infection and should not be used as the sole basis for treatment or other patient management decisions. A negative result may occur with  improper specimen collection/handling, submission of specimen other than nasopharyngeal swab, presence of viral mutation(s) within the areas targeted by this assay, and inadequate number of viral copies(<138 copies/mL). A negative result must be combined with clinical observations, patient history, and epidemiological information. The expected result is Negative.  Fact Sheet for Patients:  12/30/20  Fact Sheet for Healthcare Providers:  SeriousBroker.it  This test is no t yet approved or cleared by the [place] FDA and  has been authorized for detection and/or diagnosis of SARS-CoV-2 by FDA under an Emergency Use Authorization (EUA). This EUA will remain  in effect (meaning this test can be used) for the duration of the COVID-19 declaration under Section 564(b)(1) of the Act, 21 U.S.C.section 360bbb-3(b)(1), unless the authorization is terminated  or revoked sooner.       Influenza A by PCR NEGATIVE NEGATIVE Final   Influenza B by PCR NEGATIVE NEGATIVE Final    Comment: (NOTE) The Xpert Xpress SARS-CoV-2/FLU/RSV plus assay is intended as an aid in the diagnosis of influenza from Nasopharyngeal swab specimens  and should not be used as a sole basis for treatment. Nasal washings and aspirates are unacceptable for Xpert Xpress SARS-CoV-2/FLU/RSV testing.  Fact Sheet for Patients: BloggerCourse.com  Fact Sheet for Healthcare Providers: SeriousBroker.it  This test is not yet approved or cleared by the [place] FDA and has been authorized for detection and/or diagnosis of SARS-CoV-2 by FDA under an Emergency Use Authorization (EUA). This EUA will remain in effect (meaning this test can be used) for the duration of the COVID-19 declaration under Section 564(b)(1) of the Act, 21 U.S.C. section 360bbb-3(b)(1), unless the authorization is terminated or revoked.  Performed at [facility], [address] [place], [place] [address]          Radiology Studies: DG Chest 2 View  Result Date: 12/28/2020 CLINICAL DATA:  Short of breath for 5 days EXAM: CHEST - 2 VIEW COMPARISON:  None. FINDINGS: Frontal and lateral views of the chest demonstrate an enlarged cardiac silhouette. There is increased central vascular congestion, with patchy areas of bibasilar consolidation and small effusions, greatest on the right. No pneumothorax. No acute bony abnormalities. IMPRESSION: 1. Findings consistent with mild congestive heart failure. Electronically Signed   By: Sharlet Salina M.D.   On: 12/28/2020 20:00   ECHOCARDIOGRAM COMPLETE  Result Date: 12/29/2020    ECHOCARDIOGRAM REPORT   Patient Name:   Jacqueline Ward Date of Exam: 12/29/2020 Medical Rec #:  [id number]      Height:       66.0 in Accession #:    [id number]     Weight:       340.0 lb Date of Birth:  1976/03/17      BSA:          2.506 m Patient Age:    45 years       BP:           164/137 mmHg Patient Gender: F              HR:           95 bpm. Exam Location:  Inpatient Procedure: 2D Echo, Cardiac Doppler and Color Doppler Indications:    CHF  History:        Patient has no prior  history of Echocardiogram examinations.                 Signs/Symptoms:Shortness of Breath and LE edema; Risk                 Factors:Morbid obesity, Diabetes and Hypertension.  Sonographer:    Lavenia Atlas Referring Phys: [id number] VASUNDHRA RATHORE  Sonographer Comments: Image acquisition challenging due to patient body habitus. IMPRESSIONS  1. Left ventricular ejection fraction, by estimation, is 20 to 25%. The left ventricle  has severely decreased function. The left ventricle demonstrates global hypokinesis. The left ventricular internal cavity size was mildly dilated. There is mild left ventricular hypertrophy. Left ventricular diastolic parameters are consistent with Grade II diastolic dysfunction (pseudonormalization). Elevated left atrial pressure.  2. Right ventricular systolic function is moderately reduced. The right ventricular size is mildly enlarged. There is severely elevated pulmonary artery systolic pressure.  3. Left atrial size was mildly dilated.  4. Right atrial size was mildly dilated.  5. The mitral valve is normal in structure. Trivial mitral valve regurgitation. No evidence of mitral stenosis.  6. The aortic valve is tricuspid. Aortic valve regurgitation is trivial. No aortic stenosis is present.  7. The inferior vena cava is dilated in size with <50% respiratory variability, suggesting right atrial pressure of 15 mmHg. FINDINGS  Left Ventricle: Left ventricular ejection fraction, by estimation, is 20 to 25%. The left ventricle has severely decreased function. The left ventricle demonstrates global hypokinesis. The left ventricular internal cavity size was mildly dilated. There is mild left ventricular hypertrophy. Left ventricular diastolic parameters are consistent with Grade II diastolic dysfunction (pseudonormalization). Elevated left atrial pressure. Right Ventricle: The right ventricular size is mildly enlarged.Right ventricular systolic function is moderately reduced. There is  severely elevated pulmonary artery systolic pressure. The tricuspid regurgitant velocity is 3.39 m/s, and with an assumed right atrial pressure of 15 mmHg, the estimated right ventricular systolic pressure is 61.0 mmHg. Left Atrium: Left atrial size was mildly dilated. Right Atrium: Right atrial size was mildly dilated. Pericardium: There is no evidence of pericardial effusion. Mitral Valve: The mitral valve is normal in structure. Trivial mitral valve regurgitation. No evidence of mitral valve stenosis. Tricuspid Valve: The tricuspid valve is normal in structure. Tricuspid valve regurgitation is mild . No evidence of tricuspid stenosis. Aortic Valve: The aortic valve is tricuspid. Aortic valve regurgitation is trivial. No aortic stenosis is present. Pulmonic Valve: The pulmonic valve was normal in structure. Pulmonic valve regurgitation is trivial. No evidence of pulmonic stenosis. Aorta: The aortic root is normal in size and structure. Venous: The inferior vena cava is dilated in size with less than 50% respiratory variability, suggesting right atrial pressure of 15 mmHg. IAS/Shunts: No atrial level shunt detected by color flow Doppler. Additional Comments: There is a small pleural effusion in the left lateral region.  LEFT VENTRICLE PLAX 2D LVIDd:         5.50 cm  Diastology LVIDs:         5.20 cm  LV e' medial:    5.00 cm/s LV PW:         1.30 cm  LV E/e' medial:  22.0 LV IVS:        1.10 cm  LV e' lateral:   4.03 cm/s LVOT diam:     2.00 cm  LV E/e' lateral: 27.3 LV SV:         38 LV SV Index:   15 LVOT Area:     3.14 cm  RIGHT VENTRICLE RV Basal diam:  4.50 cm RV S prime:     5.11 cm/s TAPSE (M-mode): 1.3 cm LEFT ATRIUM           Index       RIGHT ATRIUM           Index LA diam:      4.00 cm 1.60 cm/m  RA Area:     24.90 cm LA Vol (A2C): 57.3 ml 22.87 ml/m RA Volume:   81.10 ml  32.37 ml/m LA Vol (A4C): 86.3 ml 34.44 ml/m  AORTIC VALVE LVOT Vmax:   76.90 cm/s LVOT Vmean:  46.500 cm/s LVOT VTI:    0.122 m   AORTA Ao Root diam: 2.90 cm MITRAL VALVE                TRICUSPID VALVE MV Area (PHT): 7.66 cm     TR Peak grad:   46.0 mmHg MV Decel Time: 99 msec      TR Vmax:        339.00 cm/s MV E velocity: 110.00 cm/s MV A velocity: 59.70 cm/s   SHUNTS MV E/A ratio:  1.84         Systemic VTI:  0.12 m                             Systemic Diam: 2.00 cm Olga Millers MD Electronically signed by Olga Millers MD Signature Date/Time: 12/29/2020/10:31:16 AM    Final         Scheduled Meds: . digoxin  0.125 mg Oral Daily  . furosemide  80 mg Intravenous BID  . insulin aspart  0-5 Units Subcutaneous QHS  . insulin aspart  0-9 Units Subcutaneous TID WC  . potassium chloride  20 mEq Oral BID  . sacubitril-valsartan  1 tablet Oral BID  . sodium chloride flush  3 mL Intravenous Q12H  . spironolactone  12.5 mg Oral Daily   Continuous Infusions: . sodium chloride    . heparin 1,750 Units/hr (12/29/20 0917)     LOS: 1 day    Time spent:    Zannie Cove, MD [facility]   12/29/2020, 10:46 AM

## 2020-12-29 NOTE — ED Notes (Signed)
Pt blood pressure noted to steadily increase. PRN dose of hydralazine given.

## 2020-12-29 NOTE — ED Notes (Signed)
Report given to inpatient RN at [facility].

## 2020-12-29 NOTE — Progress Notes (Signed)
Patient arrived via [facility]. Vitals taken and telemetry placed. Patient given education on fluid intake and output, new medications, and plan of care. Patient agreeable and returned education. Call bell within reach and patient provided with menu

## 2020-12-29 NOTE — Progress Notes (Signed)
ANTICOAGULATION CONSULT NOTE - Follow Up Consult  Pharmacy Consult for Heparin Indication: pulmonary embolus  No Known Allergies  Patient Measurements: Height: 5\' 6"  (167.6 cm) Weight: (!) 147.3 kg (324 lb 11.8 oz) IBW/kg (Calculated) : 59.3 Heparin Dosing Weight: 98 kg  Vital Signs: Temp: 97.6 F (36.4 C) (05/29 1720) Temp Source: Oral (05/29 1720) BP: 145/130 (05/29 1720) Pulse Rate: 98 (05/29 1720)  Labs: Recent Labs    12/27/20 0908 12/28/20 2056 12/28/20 2057 12/29/20 0500 12/29/20 0630 12/29/20 0758 12/29/20 1730  HGB 14.3 14.9  --  13.7  --   --   --   HCT 45.1 47.0*  --  42.9  --   --   --   PLT 414 427*  --  367  --   --   --   APTT  --   --  30  --   --   --   --   LABPROT  --   --  17.1*  --   --   --   --   INR  --   --  1.4*  --   --   --   --   HEPARINUNFRC  --   --   --   --  <0.10*  --  0.49  CREATININE 1.12* 1.15*  --   --   --   --   --   TROPONINIHS  --  26*  --   --   --  28*  --     Estimated Creatinine Clearance: 90.2 mL/min (A) (by C-G formula based on SCr of 1.15 mg/dL (H)).   Medications:  Infusions:  . sodium chloride    . heparin 1,750 Units/hr (12/29/20 12/31/20)    Assessment: 45 year old female with a history of obesity, DM type II, hypertension presents to the ER with complaints of shortness of breath x1 week. Heparin per Rx for rule out PE.  No prior AC noted.  Baseline aPTT 30, INR 1.4, Ddimer 6.27.  VQ scan and bilateral lower extremity Dopplers have been ordered.  Heparin level came back therapeutic at 0.49, on 1750 units/hr. No s/sx of bleeding or infusion issues per RN. CBC stable this morning.  Goal of Therapy:  Heparin level 0.3-0.7 units/ml Monitor platelets by anticoagulation protocol: Yes   Plan:  Continue heparin IV infusion at 1750 units/hr Daily heparin level and CBC Continue to monitor H&H and platelets Follow up VQ scan, dopplers.  55, PharmD, BCCCP Clinical Pharmacist  Phone: [phone] 12/29/2020  6:18 PM  Please check AMION for all [facility] Pharmacy phone numbers After 10:00 PM, call Main Pharmacy [phone]

## 2020-12-29 NOTE — Progress Notes (Addendum)
ANTICOAGULATION CONSULT NOTE - Follow Up Consult  Pharmacy Consult for Heparin Indication: pulmonary embolus  No Known Allergies  Patient Measurements: Height: 5\' 6"  (167.6 cm) Weight: (!) 154.2 kg (340 lb) IBW/kg (Calculated) : 59.3 Heparin Dosing Weight: 98 kg  Vital Signs: Temp: 98.7 F (37.1 C) (05/28 1933) Temp Source: Oral (05/28 1933) BP: 153/122 (05/29 0700) Pulse Rate: 86 (05/29 0700)  Labs: Recent Labs    12/27/20 0908 12/28/20 2056 12/28/20 2057  HGB 14.3 14.9  --   HCT 45.1 47.0*  --   PLT 414 427*  --   APTT  --   --  30  LABPROT  --   --  17.1*  INR  --   --  1.4*  CREATININE 1.12* 1.15*  --   TROPONINIHS  --  26*  --     Estimated Creatinine Clearance: 92.9 mL/min (A) (by C-G formula based on SCr of 1.15 mg/dL (H)).   Medications:  Infusions:  . sodium chloride    . heparin 1,600 Units/hr (12/29/20 0026)    Assessment: 45 year old female with a history of obesity, DM type II, hypertension presents to the ER with complaints of shortness of breath x1 week. Heparin per Rx for rule out PE.  No prior AC noted.  Baseline aPTT 30, INR 1.4, Ddimer 6.27.  VQ scan and bilateral lower extremity Dopplers have been ordered.  Today, 12/29/2020: Heparin level < 0.1 on heparin at 1600 units/hr - RN reports Heparin was off for about 30 minutes prior to obtaining heparin level.   - Expect that true heparin level would still be below goal due to undetectable level. CBC:  Hgb and Plt WNL No bleeding or complications reported   Goal of Therapy:  Heparin level 0.3-0.7 units/ml Monitor platelets by anticoagulation protocol: Yes   Plan:  Increase to heparin IV infusion at 1750 units/hr Heparin level 6 hours after starting Daily heparin level and CBC Continue to monitor H&H and platelets Follow up VQ scan, dopplers.   12/31/2020 PharmD, BCPS Clinical Pharmacist [facility] main pharmacy [phone] 12/29/2020 7:07 AM

## 2020-12-29 NOTE — ED Notes (Signed)
Ice chips provided.

## 2020-12-29 NOTE — Consult Note (Addendum)
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 No chest pain Diet is salt replete.  Hypertension, but not on meds, untreated likely sleep apnea. No family hx of heart disease No palps.    Past Medical History:  Diagnosis Date  . Diabetes mellitus without complication (HCC)   . Hypertension   . Obese   . Sialolithiasis of submandibular gland 2019    Past Surgical History:  Procedure Laterality Date  . LIPECTOMY  2004       Inpatient Medications: Scheduled Meds: . furosemide  80 mg Intravenous BID  . insulin aspart  0-5 Units Subcutaneous QHS  . insulin aspart  0-9 Units Subcutaneous TID WC  . sodium chloride flush  3 mL Intravenous Q12H   Continuous Infusions: . sodium chloride    . heparin 1,600 Units/hr (12/29/20 0026)   PRN Meds: sodium chloride, acetaminophen, hydrALAZINE, sodium chloride flush  Allergies:   No Known Allergies  Social History:   Social History   Socioeconomic History  . Marital status: Single    Spouse name: Not on file  . Number of children: 0  . Years of education: Not on file  . Highest education level: Not on file  Occupational History  . Not on file  Tobacco Use  . Smoking status: Never Smoker  . Smokeless tobacco: Never Used  Vaping Use  .  Vaping Use: Never used  Substance and Sexual Activity  . Alcohol use: No    Alcohol/week: 0.0 standard drinks  . Drug use: No  . Sexual activity: Not Currently  Other Topics Concern  . Not on file  Social History Narrative  . Not on file   Social Determinants of Health   Financial Resource Strain: Not on file  Food Insecurity: Not on file  Transportation Needs: Not on file  Physical Activity: Not on file  Stress: Not on file  Social Connections: Not on file  Intimate Partner Violence: Not on file    Family History:     Family History  Problem Relation Age of Onset  . Cancer Mother   . Hypertension Mother   . Pancreatitis Father   . Diabetes Father      ROS:  Please see the history of present illness.    All other ROS reviewed and negative.     Physical Exam/Data:   Vitals:   12/29/20 0145 12/29/20 0406 12/29/20 0700 12/29/20 0800  BP:  (!) 168/125 (!) 153/122 (!) 164/137  Pulse: (!) 108 100 86 95  Resp: (!) 28 19 (!) 24 (!) 27  Temp:      TempSrc:      SpO2: 94% 100% 95% 100%  Weight:      Height:  No intake or output data in the 24 hours ending 12/29/20 0845 Last 3 Weights 12/28/2020 12/27/2020 04/19/2018  Weight (lbs) 340 lb 337 lb 311 lb 1 oz  Weight (kg) 154.223 kg 152.862 kg 141.097 kg     Body mass index is 54.88 kg/m.  General:  Well nourished, Morbidly obese , in no acute distress   HEENT: normal Lymph: no adenopathy Neck: JVD > jaw with HJR  Endocrine:  No thryomegaly Vascular: No carotid bruits; FA pulses 2+ bilaterally without bruits  Cardiac:  normal S1, S2; RRR; no murmur   Lungs:  clear to auscultation bilaterally, no wheezing, rhonchi or rales  Abd: soft, nontender, no hepatomegaly distended  Ext: 2-3+edema>>< knee Musculoskeletal:  No deformities, BUE and BLE strength normal and equal Skin: warm and dry  Neuro:  CNs 2-12 intact, no focal abnormalities noted Psych:  Normal affect   EKG:  The EKG was personally reviewed and  demonstrates:  Sinus tachy RAE and LAD  Telemetry:  Telemetry was personally reviewed and demonstrates:  Sinus   Relevant CV Studies:    Laboratory Data:  High Sensitivity Troponin:   Recent Labs  Lab 12/28/20 2056  TROPONINIHS 26*     Chemistry Recent Labs  Lab 12/27/20 0908 12/28/20 2056  NA 141 140  K 4.2 4.0  CL 101 103  CO2 24 27  GLUCOSE 111* 105*  BUN 19 20  CREATININE 1.12* 1.15*  CALCIUM 9.4 9.1  GFRNONAA  --  59*  ANIONGAP  --  10    Recent Labs  Lab 12/27/20 0908 12/28/20 2056  PROT 6.9 6.8  ALBUMIN 3.8 3.3*  AST 24 32  ALT 21 23  ALKPHOS 144* 111  BILITOT 1.3* 1.7*   Hematology Recent Labs  Lab 12/27/20 0908 12/28/20 2056 12/29/20 0500  WBC 5.7 7.1 6.6  RBC 5.43* 5.59* 5.21*  HGB 14.3 14.9 13.7  HCT 45.1 47.0* 42.9  MCV 83 84.1 82.3  MCH 26.3* 26.7 26.3  MCHC 31.7 31.7 31.9  RDW 18.1* 19.4* 18.9*  PLT 414 427* 367   BNP Recent Labs  Lab 12/27/20 0908 12/28/20 2056  BNP 3,439.9* 2,539.8*    DDimer  Recent Labs  Lab 12/28/20 2057  DDIMER 6.27*     Radiology/Studies:  DG Chest 2 View  Result Date: 12/28/2020 CLINICAL DATA:  Short of breath for 5 days EXAM: CHEST - 2 VIEW COMPARISON:  None. FINDINGS: Frontal and lateral views of the chest demonstrate an enlarged cardiac silhouette. There is increased central vascular congestion, with patchy areas of bibasilar consolidation and small effusions, greatest on the right. No pneumothorax. No acute bony abnormalities. IMPRESSION: 1. Findings consistent with mild congestive heart failure. Electronically Signed   By: Sharlet Salina M.D.   On: 12/28/2020 20:00     Assessment and Plan:   CHF acute/chronic   Hypertension  Morbid Obesity  Sleep disordered breathing   Ddimer elevated  New onset heart failure with significant weight gain, volume overload in setting hypertension   EF significantly reduced  HR up as compensatory consistent with decompensated CHF   BP significantly  elevated so, following discussion with Dr Dorthea Cove will begin entresto 49/51, aldactone and dig.  ALso K supplement; continue IV diuresis   Mechanism of her CM not clear, ? Hypertensive heart disease but not all that thickened. Chronic symptoms make it hard to implicate a specific instance of possible viral disease  Will need outpt sleep study   Not sure how to interpret ddimer but  I think pretest likelihood of PE is low, and the post test likelihood of VQ scan in Morbidly obese pts is also low,  Would reconsider the role of anticoagulation and possible PE given the echo data       Risk Assessment/Risk Scores:         For questions or updates, please contact [facility] Please consult [email or web address] for contact info under    Signed, Sherryl Manges, MD  12/29/2020 8:45 AM

## 2020-12-30 ENCOUNTER — Inpatient Hospital Stay (HOSPITAL_COMMUNITY): Payer: No Typology Code available for payment source

## 2020-12-30 ENCOUNTER — Encounter (HOSPITAL_COMMUNITY): Payer: No Typology Code available for payment source

## 2020-12-30 DIAGNOSIS — I5021 Acute systolic (congestive) heart failure: Secondary | ICD-10-CM | POA: Diagnosis not present

## 2020-12-30 DIAGNOSIS — I272 Pulmonary hypertension, unspecified: Secondary | ICD-10-CM | POA: Diagnosis not present

## 2020-12-30 LAB — COMPREHENSIVE METABOLIC PANEL
ALT: 23 U/L (ref 0–44)
AST: 32 U/L (ref 15–41)
Albumin: 2.9 g/dL — ABNORMAL LOW (ref 3.5–5.0)
Alkaline Phosphatase: 108 U/L (ref 38–126)
Anion gap: 10 (ref 5–15)
BUN: 13 mg/dL (ref 6–20)
CO2: 30 mmol/L (ref 22–32)
Calcium: 8.3 mg/dL — ABNORMAL LOW (ref 8.9–10.3)
Chloride: 101 mmol/L (ref 98–111)
Creatinine, Ser: 1 mg/dL (ref 0.44–1.00)
GFR, Estimated: >60 mL/min (ref >60)
Glucose, Bld: 126 mg/dL — ABNORMAL HIGH (ref 70–99)
Potassium: 3.1 mmol/L — ABNORMAL LOW (ref 3.5–5.1)
Sodium: 141 mmol/L (ref 135–145)
Total Bilirubin: 1.5 mg/dL — ABNORMAL HIGH (ref 0.3–1.2)
Total Protein: 6.9 g/dL (ref 6.5–8.1)

## 2020-12-30 LAB — COOXEMETRY PANEL
Carboxyhemoglobin: 1.5 % (ref 0.5–1.5)
Methemoglobin: 0.9 % (ref 0.0–1.5)
O2 Saturation: 61.9 %
Total hemoglobin: 13.2 g/dL (ref 12.0–16.0)

## 2020-12-30 LAB — GLUCOSE, CAPILLARY
Glucose-Capillary: 127 mg/dL — ABNORMAL HIGH (ref 70–99)
Glucose-Capillary: 135 mg/dL — ABNORMAL HIGH (ref 70–99)
Glucose-Capillary: 123 mg/dL — ABNORMAL HIGH (ref 70–99)
Glucose-Capillary: 116 mg/dL — ABNORMAL HIGH (ref 70–99)

## 2020-12-30 LAB — CBC
HCT: 43.6 % (ref 36.0–46.0)
Hemoglobin: 13.8 g/dL (ref 12.0–15.0)
MCH: 26.1 pg (ref 26.0–34.0)
MCHC: 31.7 g/dL (ref 30.0–36.0)
MCV: 82.6 fL (ref 80.0–100.0)
Platelets: 382 10*3/uL (ref 150–400)
RBC: 5.28 MIL/uL — ABNORMAL HIGH (ref 3.87–5.11)
RDW: 19 % — ABNORMAL HIGH (ref 11.5–15.5)
WBC: 5.8 10*3/uL (ref 4.0–10.5)
nRBC: 0 % (ref 0.0–0.2)

## 2020-12-30 LAB — BASIC METABOLIC PANEL
Anion gap: 8 (ref 5–15)
BUN: 13 mg/dL (ref 6–20)
CO2: 30 mmol/L (ref 22–32)
Calcium: 8.2 mg/dL — ABNORMAL LOW (ref 8.9–10.3)
Chloride: 102 mmol/L (ref 98–111)
Creatinine, Ser: 1.02 mg/dL — ABNORMAL HIGH (ref 0.44–1.00)
GFR, Estimated: >60 mL/min (ref >60)
Glucose, Bld: 127 mg/dL — ABNORMAL HIGH (ref 70–99)
Potassium: 3.5 mmol/L (ref 3.5–5.1)
Sodium: 140 mmol/L (ref 135–145)

## 2020-12-30 LAB — TSH: TSH: 1.081 u[IU]/mL (ref 0.350–4.500)

## 2020-12-30 LAB — MAGNESIUM: Magnesium: 1.6 mg/dL — ABNORMAL LOW (ref 1.7–2.4)

## 2020-12-30 LAB — HEPARIN LEVEL (UNFRACTIONATED): Heparin Unfractionated: 0.46 IU/mL (ref 0.30–0.70)

## 2020-12-30 MED ORDER — MAGNESIUM SULFATE 2 GM/50ML IV SOLN
2.0000 g | Freq: Once | INTRAVENOUS | Status: AC
  Administered 2020-12-30: 2 g via INTRAVENOUS
  Filled 2020-12-30: qty 50

## 2020-12-30 MED ORDER — SPIRONOLACTONE 25 MG PO TABS
25.0000 mg | ORAL_TABLET | Freq: Every day | ORAL | Status: DC
  Administered 2020-12-30 – 2021-01-03 (×5): 25 mg via ORAL
  Filled 2020-12-30 (×5): qty 1

## 2020-12-30 MED ORDER — FUROSEMIDE 10 MG/ML IJ SOLN
80.0000 mg | Freq: Once | INTRAMUSCULAR | Status: AC
  Administered 2020-12-30: 80 mg via INTRAVENOUS
  Filled 2020-12-30: qty 8

## 2020-12-30 MED ORDER — POTASSIUM CHLORIDE CRYS ER 20 MEQ PO TBCR
40.0000 meq | EXTENDED_RELEASE_TABLET | Freq: Two times a day (BID) | ORAL | Status: DC
  Administered 2020-12-30 – 2020-12-31 (×3): 40 meq via ORAL
  Filled 2020-12-30 (×3): qty 2

## 2020-12-30 MED ORDER — SODIUM CHLORIDE 0.9% FLUSH: 10.0000 mL | INTRAVENOUS | Status: DC | PRN

## 2020-12-30 MED ORDER — POTASSIUM CHLORIDE CRYS ER 20 MEQ PO TBCR
40.0000 meq | EXTENDED_RELEASE_TABLET | ORAL | Status: AC
  Administered 2020-12-30 (×2): 40 meq via ORAL
  Filled 2020-12-30 (×2): qty 2

## 2020-12-30 MED ORDER — CHLORHEXIDINE GLUCONATE CLOTH 2 % EX PADS
6.0000 | MEDICATED_PAD | Freq: Every day | CUTANEOUS | Status: DC
  Administered 2020-12-30 – 2021-01-03 (×5): 6 via TOPICAL

## 2020-12-30 NOTE — Progress Notes (Signed)
Cardiology Progress Note  Patient ID: BRITANY Ward MRN: [id number] DOB: 07/11/1976 Date of Encounter: 12/30/2020  Primary Cardiologist: None  Subjective   Chief Complaint: Shortness of breath  HPI: Good urine output.  Hypokalemic this morning.  She is net -8.5 L.  Diuresis needs to be held until her potassium has been repleted.  BP improved.  Echocardiogram shows severe biventricular failure.  She has severe pulm hypertension.  Awaiting VQ scan.  ROS:  All other ROS reviewed and negative. Pertinent positives noted in the HPI.     Inpatient Medications  Scheduled Meds: . digoxin  0.125 mg Oral Daily  . furosemide  80 mg Intravenous Once  . insulin aspart  0-5 Units Subcutaneous QHS  . insulin aspart  0-9 Units Subcutaneous TID WC  . potassium chloride  40 mEq Oral Q4H  . potassium chloride  40 mEq Oral BID  . sacubitril-valsartan  1 tablet Oral BID  . sodium chloride flush  3 mL Intravenous Q12H  . spironolactone  12.5 mg Oral Daily   Continuous Infusions: . sodium chloride    . heparin 1,750 Units/hr (12/29/20 0917)  . magnesium sulfate bolus IVPB     PRN Meds: sodium chloride, acetaminophen, alum & mag hydroxide-simeth, hydrALAZINE, sodium chloride flush   Vital Signs   Vitals:   12/29/20 1720 12/29/20 2033 12/30/20 0419 12/30/20 0420  BP: (!) 145/130 (!) 141/102  (!) 139/91  Pulse: 98 96  99  Resp: 18 18  18   Temp: 97.6 F (36.4 C) 97.6 F (36.4 C)  98.5 F (36.9 C)  TempSrc: Oral Oral  Oral  SpO2: 98% 95%  95%  Weight: (!) 147.3 kg  (!) (P) 144.7 kg   Height: 5\' 6"  (1.676 m)       Intake/Output Summary (Last 24 hours) at 12/30/2020 0814 Last data filed at 12/30/2020 0007 Gross per 24 hour  Intake 530.04 ml  Output 9100 ml  Net -8569.96 ml   Last 3 Weights 12/30/2020 12/29/2020 12/28/2020  Weight (lbs) 319 lb 0.1 oz 324 lb 11.8 oz 340 lb  Weight (kg) 144.7 kg 147.3 kg 154.223 kg      Telemetry  Overnight telemetry shows sinus rhythm, which I personally  reviewed.   Physical Exam   Vitals:   12/29/20 1720 12/29/20 2033 12/30/20 0419 12/30/20 0420  BP: (!) 145/130 (!) 141/102  (!) 139/91  Pulse: 98 96  99  Resp: 18 18  18   Temp: 97.6 F (36.4 C) 97.6 F (36.4 C)  98.5 F (36.9 C)  TempSrc: Oral Oral  Oral  SpO2: 98% 95%  95%  Weight: (!) 147.3 kg  (!) (P) 144.7 kg   Height: 5\' 6"  (1.676 m)        Intake/Output Summary (Last 24 hours) at 12/30/2020 0814 Last data filed at 12/30/2020 0007 Gross per 24 hour  Intake 530.04 ml  Output 9100 ml  Net -8569.96 ml    Last 3 Weights 12/30/2020 12/29/2020 12/28/2020  Weight (lbs) 319 lb 0.1 oz 324 lb 11.8 oz 340 lb  Weight (kg) 144.7 kg 147.3 kg 154.223 kg    Body mass index is 51.49 kg/m (pended).  General: Well nourished, well developed, in no acute distress Head: Atraumatic, normal size  Eyes: PEERLA, EOMI  Neck: Supple, JVD 15 to 17 cm of water Endocrine: No thryomegaly Cardiac: Normal S1, S2; RRR; no murmurs, rubs, or gallops Lungs: Crackles at the lung bases Abd: Soft, nontender, no hepatomegaly  Ext: 2+ pitting edema Musculoskeletal:  No deformities, BUE and BLE strength normal and equal Skin: Warm and dry, no rashes   Neuro: Alert and oriented to person, place, time, and situation, CNII-XII grossly intact, no focal deficits  Psych: Normal mood and affect   Labs  High Sensitivity Troponin:   Recent Labs  Lab 12/28/20 2056 12/29/20 0758  TROPONINIHS 26* 28*     Cardiac EnzymesNo results for input(s): TROPONINI in the last 168 hours. No results for input(s): TROPIPOC in the last 168 hours.  Chemistry Recent Labs  Lab 12/27/20 0908 12/28/20 2056 12/29/20 1904 12/30/20 0224  NA 141 140 140 141  K 4.2 4.0 3.3* 3.1*  CL 101 103 102 101  CO2 24 27 28 30   GLUCOSE 111* 105* 101* 126*  BUN 19 20 15 13   CREATININE 1.12* 1.15* 1.02* 1.00  CALCIUM 9.4 9.1 8.3* 8.3*  PROT 6.9 6.8  --  6.9  ALBUMIN 3.8 3.3*  --  2.9*  AST 24 32  --  32  ALT 21 23  --  23  ALKPHOS 144*  111  --  108  BILITOT 1.3* 1.7* 1.5* 1.5*  GFRNONAA  --  59* >60 >60  ANIONGAP  --  10 10 10     Hematology Recent Labs  Lab 12/28/20 2056 12/29/20 0500 12/30/20 0224  WBC 7.1 6.6 5.8  RBC 5.59* 5.21* 5.28*  HGB 14.9 13.7 13.8  HCT 47.0* 42.9 43.6  MCV 84.1 82.3 82.6  MCH 26.7 26.3 26.1  MCHC 31.7 31.9 31.7  RDW 19.4* 18.9* 19.0*  PLT 427* 367 382   BNP Recent Labs  Lab 12/27/20 0908 12/28/20 2056  BNP 3,439.9* 2,539.8*    DDimer  Recent Labs  Lab 12/28/20 2057  DDIMER 6.27*     Radiology  DG Chest 2 View  Result Date: 12/28/2020 CLINICAL DATA:  Short of breath for 5 days EXAM: CHEST - 2 VIEW COMPARISON:  None. FINDINGS: Frontal and lateral views of the chest demonstrate an enlarged cardiac silhouette. There is increased central vascular congestion, with patchy areas of bibasilar consolidation and small effusions, greatest on the right. No pneumothorax. No acute bony abnormalities. IMPRESSION: 1. Findings consistent with mild congestive heart failure. Electronically Signed   By: Sharlet SalinaMichael  Brown M.D.   On: 12/28/2020 20:00   ECHOCARDIOGRAM COMPLETE  Result Date: 12/29/2020    ECHOCARDIOGRAM REPORT   Patient Name:   Jacqueline Ward Date of Exam: 12/29/2020 Medical Rec #:  [id number]      Height:       66.0 in Accession #:    [id number]     Weight:       340.0 lb Date of Birth:  [birth year]      BSA:          2.506 m Patient Age:    45 years       BP:           164/137 mmHg Patient Gender: F              HR:           95 bpm. Exam Location:  Inpatient Procedure: 2D Echo, Cardiac Doppler and Color Doppler Indications:    CHF  History:        Patient has no prior history of Echocardiogram examinations.                 Signs/Symptoms:Shortness of Breath and LE edema; Risk  Factors:Morbid obesity, Diabetes and Hypertension.  Sonographer:    Lavenia Atlas Referring Phys: [id number] VASUNDHRA RATHORE  Sonographer Comments: Image acquisition challenging due to patient body  habitus. IMPRESSIONS  1. Left ventricular ejection fraction, by estimation, is 20 to 25%. The left ventricle has severely decreased function. The left ventricle demonstrates global hypokinesis. The left ventricular internal cavity size was mildly dilated. There is mild left ventricular hypertrophy. Left ventricular diastolic parameters are consistent with Grade II diastolic dysfunction (pseudonormalization). Elevated left atrial pressure.  2. Right ventricular systolic function is moderately reduced. The right ventricular size is mildly enlarged. There is severely elevated pulmonary artery systolic pressure.  3. Left atrial size was mildly dilated.  4. Right atrial size was mildly dilated.  5. The mitral valve is normal in structure. Trivial mitral valve regurgitation. No evidence of mitral stenosis.  6. The aortic valve is tricuspid. Aortic valve regurgitation is trivial. No aortic stenosis is present.  7. The inferior vena cava is dilated in size with <50% respiratory variability, suggesting right atrial pressure of 15 mmHg. FINDINGS  Left Ventricle: Left ventricular ejection fraction, by estimation, is 20 to 25%. The left ventricle has severely decreased function. The left ventricle demonstrates global hypokinesis. The left ventricular internal cavity size was mildly dilated. There is mild left ventricular hypertrophy. Left ventricular diastolic parameters are consistent with Grade II diastolic dysfunction (pseudonormalization). Elevated left atrial pressure. Right Ventricle: The right ventricular size is mildly enlarged.Right ventricular systolic function is moderately reduced. There is severely elevated pulmonary artery systolic pressure. The tricuspid regurgitant velocity is 3.39 m/s, and with an assumed right atrial pressure of 15 mmHg, the estimated right ventricular systolic pressure is 61.0 mmHg. Left Atrium: Left atrial size was mildly dilated. Right Atrium: Right atrial size was mildly dilated.  Pericardium: There is no evidence of pericardial effusion. Mitral Valve: The mitral valve is normal in structure. Trivial mitral valve regurgitation. No evidence of mitral valve stenosis. Tricuspid Valve: The tricuspid valve is normal in structure. Tricuspid valve regurgitation is mild . No evidence of tricuspid stenosis. Aortic Valve: The aortic valve is tricuspid. Aortic valve regurgitation is trivial. No aortic stenosis is present. Pulmonic Valve: The pulmonic valve was normal in structure. Pulmonic valve regurgitation is trivial. No evidence of pulmonic stenosis. Aorta: The aortic root is normal in size and structure. Venous: The inferior vena cava is dilated in size with less than 50% respiratory variability, suggesting right atrial pressure of 15 mmHg. IAS/Shunts: No atrial level shunt detected by color flow Doppler. Additional Comments: There is a small pleural effusion in the left lateral region.  LEFT VENTRICLE PLAX 2D LVIDd:         5.50 cm  Diastology LVIDs:         5.20 cm  LV e' medial:    5.00 cm/s LV PW:         1.30 cm  LV E/e' medial:  22.0 LV IVS:        1.10 cm  LV e' lateral:   4.03 cm/s LVOT diam:     2.00 cm  LV E/e' lateral: 27.3 LV SV:         38 LV SV Index:   15 LVOT Area:     3.14 cm  RIGHT VENTRICLE RV Basal diam:  4.50 cm RV S prime:     5.11 cm/s TAPSE (M-mode): 1.3 cm LEFT ATRIUM           Index       RIGHT ATRIUM  Index LA diam:      4.00 cm 1.60 cm/m  RA Area:     24.90 cm LA Vol (A2C): 57.3 ml 22.87 ml/m RA Volume:   81.10 ml  32.37 ml/m LA Vol (A4C): 86.3 ml 34.44 ml/m  AORTIC VALVE LVOT Vmax:   76.90 cm/s LVOT Vmean:  46.500 cm/s LVOT VTI:    0.122 m  AORTA Ao Root diam: 2.90 cm MITRAL VALVE                TRICUSPID VALVE MV Area (PHT): 7.66 cm     TR Peak grad:   46.0 mmHg MV Decel Time: 99 msec      TR Vmax:        339.00 cm/s MV E velocity: 110.00 cm/s MV A velocity: 59.70 cm/s   SHUNTS MV E/A ratio:  1.84         Systemic VTI:  0.12 m                              Systemic Diam: 2.00 cm Olga Millers MD Electronically signed by Olga Millers MD Signature Date/Time: 12/29/2020/10:31:16 AM    Final     Cardiac Studies  TTE 12/29/2020 1. Left ventricular ejection fraction, by estimation, is 20 to 25%. The  left ventricle has severely decreased function. The left ventricle  demonstrates global hypokinesis. The left ventricular internal cavity size  was mildly dilated. There is mild left  ventricular hypertrophy. Left ventricular diastolic parameters are  consistent with Grade II diastolic dysfunction (pseudonormalization).  Elevated left atrial pressure.  2. Right ventricular systolic function is moderately reduced. The right  ventricular size is mildly enlarged. There is severely elevated pulmonary  artery systolic pressure.  3. Left atrial size was mildly dilated.  4. Right atrial size was mildly dilated.  5. The mitral valve is normal in structure. Trivial mitral valve  regurgitation. No evidence of mitral stenosis.  6. The aortic valve is tricuspid. Aortic valve regurgitation is trivial.  No aortic stenosis is present.  7. The inferior vena cava is dilated in size with <50% respiratory  variability, suggesting right atrial pressure of 15 mmHg.   Patient Profile  Jacqueline Ward is a 45 y.o. female with morbid obesity, diabetes, hypertension who was admitted on 12/29/2020 with new onset biventricular failure.  Assessment & Plan   1.  New onset systolic heart failure, EF 20%/RV failure/severe pulmonary hypertension -Admitted with months of volume overload.  Found to have newly reduced EF.  Also with severe RV failure and pulmonary hypertension. -Suspect her RV failure is related to left heart failure. -Still volume overloaded. -We will hold her Lasix this morning.  Her potassium was 3.1.  I have given her 40 mEq every 4 hours for 2 doses.  We will then check a BMP this afternoon.  As long as her potassium is above 3.5 she can start with  further diuresis.  I have ordered her Lasix 80 mg this afternoon.  We will also start scheduled potassium this afternoon.  This is scheduled for 6 PM. -Holding beta-blocker per recommendations by advanced heart failure team.  She is on digoxin 0.125 mg daily.   -Beta-blocker can be started in the next few days. -Continue Entresto 49-51 mg twice daily. -I will increase her Aldactone to 25 mg daily.  This will help with potassium. -Etiology of her cardiomyopathy is unclear.  Could be hypertensive heart disease.  No  family history of CHF.  She is only been diabetic for roughly 2 years.  A1c 7.4.  I have added on a TSH.  I do not see when checked recently. Ischemic heart disease is low on my list.  However given how severe her biventricular failure is she will benefit from left and right heart catheterization this admission.  Would anticipate this to be done in the next 1 to 2 days.  She cannot lay flat.  We are also working her up for possible pulmonary embolism.  This will need to be excluded before we pursue left and right heart catheterization.  2.  RV failure/severe pulm hypertension -Suspect this is related to left heart failure.  She does have sleep apnea.  This will need to be evaluated.  Plan for further evaluation with left and right heart catheterization when she is more appropriate for the procedure.  For questions or updates, please contact [facility] Please consult [email or web address] for contact info under   Time Spent with Patient: I have spent a total of 35 minutes with patient reviewing hospital notes, telemetry, EKGs, labs and examining the patient as well as establishing an assessment and plan that was discussed with the patient.  > 50% of time was spent in direct patient care.    Signed, Lenna Gilford. Flora Lipps, MD, [facility] [place]  [facility]  12/30/2020 8:14 AM

## 2020-12-30 NOTE — Progress Notes (Signed)
RN made nuc med aware that patient was ready and 1 view portable xray has been done. They are awaiting scanning material for patient.

## 2020-12-30 NOTE — Progress Notes (Signed)
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 Office visit 5/27 indicates 54 pound weight gain in 2 months-very active lady works out 3 times a week previously followed by Museum/gallery conservator at [facility] with some good improvement -In the ER, creatinine was 1.12, BNP was 3439, high-sensitivity troponin was 28, Urinalysis was negative for microalbuminuria. -Diuresed with good response, EF of 20-25%, global hypokinesis, mild LVH, grade 2 diastolic dysfunction, RV systolic function is moderately reduced, severe elevated PASP 5/29-transferred to [facility] for cardiology evaluation  Assessment & Plan:   Acute systolic CHF Admitted with significant volume overload, anasarca Echo : EF of 20-25%, global hypokinesis, mild LVH, grade 2 diastolic dysfunction, RV systolic function is moderately reduced, severe elevated PASP Planning as per cardiology-?  Cardiac cath 6/1 current meds: Digoxin 0.125, Entresto 1 tab twice daily, Aldactone 25 -I/O: -8 L -Currently Lasix 80 daily IV  Elevated D-dimer Clinical suspicion more concerning heart failure >PE VQ scan cannot be done today (patient could not lay flat on 5/29 and declined to do so)  continue IV heparin Follow lower extremity duplex  Elevated troponin Minimally elevated high-sensitivity troponin,  EKG without acute findings  Uncontrolled hypertension Deferring to cardiology  Type 2 diabetes mellitus Hemoglobin A1c is 7.4, continue sensitive sliding scale  Obesity-previously followed at [facility] with good result OHSS-tells me she snores Will administer Epworth today Will get nighttime pulse ox and if  she qualifies we will asked TOC to assist her with management  Mild hyperbilirubinemia Etiology unclear LFTs otherwise normal  Mild hypokalemia Replace potassium, check a.m.  DVT prophylaxis: Heparin Code Status: Full code Family Communication: Discussed patient in detail, no family at bedside Disposition Plan:  Status is: Inpatient  Remains inpatient appropriate because:Inpatient level of care appropriate due to severity of illness   Dispo: The patient is from: Home              Anticipated d/c is to: Home              Patient currently is not medically stable to d/c.   Difficult to place patient No  Consultants:   Cardiology discussed with me personally Dr. Mickie Kay   Procedures:   Antimicrobials:    Subjective: Can lay flat more comfortable breathing no chest pain No fever no chills  Objective: Vitals:   12/29/20 1720 12/29/20 2033 12/30/20 0419 12/30/20 0420  BP: (!) 145/130 (!) 141/102  (!) 139/91  Pulse: 98 96  99  Resp: 18 18  18   Temp: 97.6 F (36.4 C) 97.6 F (36.4 C)  98.5 F (36.9 C)  TempSrc: Oral Oral  Oral  SpO2: 98% 95%  95%  Weight: (!) 147.3 kg  (!) (P) 144.7 kg   Height: 5\' 6"  (1.676 m)       Intake/Output Summary (Last 24 hours) at 12/30/2020 1015 Last data filed at 12/30/2020 0918 Gross per 24 hour  Intake 800.04 ml  Output 8550 ml  Net -7749.96 ml   Filed Weights   12/28/20 1933 12/29/20 1720 12/30/20 0419  Weight: (!) 154.2 kg (!) 147.3 kg (!) (P) 144.7 kg    Examination:  Pleasant no distress  Mallampati 4 CTA B no added sound Abdomen obese nontender no rebound no guarding Chest clear On monitors does have NSVT with PVCs Grade 2 lower extremity edema ROM intact.     Data Reviewed:   CBC: Recent Labs  Lab 12/27/20 0908 12/28/20 2056 12/29/20 0500 12/30/20 0224  WBC 5.7 7.1 6.6 5.8  NEUTROABS 3.7 4.6  --   --   HGB 14.3 14.9 13.7 13.8  HCT 45.1 47.0* 42.9 43.6  MCV 83 84.1 82.3 82.6  PLT 414 427* 367 382   Basic  Metabolic Panel: Recent Labs  Lab 12/27/20 0908 12/28/20 2056 12/29/20 1904 12/30/20 0224  NA 141 140 140 141  K 4.2 4.0 3.3* 3.1*  CL 101 103 102 101  CO2 24 27 28 30   GLUCOSE 111* 105* 101* 126*  BUN 19 20 15 13   CREATININE 1.12* 1.15* 1.02* 1.00  CALCIUM 9.4 9.1 8.3* 8.3*   GFR: Estimated Creatinine Clearance: 103.8 mL/min (by C-G formula based on SCr of 1 mg/dL). Liver Function Tests: Recent Labs  Lab 12/27/20 0908 12/28/20 2056 12/29/20 1904 12/30/20 0224  AST 24 32  --  32  ALT 21 23  --  23  ALKPHOS 144* 111  --  108  BILITOT 1.3* 1.7* 1.5* 1.5*  PROT 6.9 6.8  --  6.9  ALBUMIN 3.8 3.3*  --  2.9*   No results for input(s): LIPASE, AMYLASE in the last 168 hours. No results for input(s): AMMONIA in the last 168 hours. Coagulation Profile: Recent Labs  Lab 12/28/20 2057  INR 1.4*   Cardiac Enzymes: No results for input(s): CKTOTAL, CKMB, CKMBINDEX, TROPONINI in the last 168 hours. BNP (last 3 results) No results for input(s): PROBNP in the last 8760 hours. HbA1C: No results for input(s): HGBA1C in the last 72 hours. CBG: Recent Labs  Lab 12/29/20 0750 12/29/20 1255 12/29/20 1758 12/29/20 2113 12/30/20 0614  GLUCAP 115* 126* 107* 130* 127*   Lipid Profile: No results for input(s): CHOL, HDL, LDLCALC, TRIG, CHOLHDL, LDLDIRECT in the last 72 hours. Thyroid Function Tests: No results for input(s): TSH, T4TOTAL, FREET4, T3FREE, THYROIDAB in the last 72 hours. Anemia Panel: No results for input(s): VITAMINB12, FOLATE, FERRITIN, TIBC, IRON, RETICCTPCT in the last 72 hours. Urine analysis:    Component Value Date/Time   COLORURINE STRAW (A) 12/29/2020 0808   APPEARANCEUR CLEAR 12/29/2020 0808   LABSPEC 1.005 12/29/2020 0808   PHURINE 6.0 12/29/2020 0808   GLUCOSEU NEGATIVE 12/29/2020 0808   HGBUR NEGATIVE 12/29/2020 0808   BILIRUBINUR NEGATIVE 12/29/2020 0808   KETONESUR NEGATIVE 12/29/2020 0808   PROTEINUR NEGATIVE 12/29/2020 0808   NITRITE  NEGATIVE 12/29/2020 0808   LEUKOCYTESUR NEGATIVE 12/29/2020 0808   Sepsis Labs: @LABRCNTIP (procalcitonin:4,lacticidven:4)  ) Recent Results (from the past 240 hour(s))  Resp Panel by RT-PCR (Flu A&B, Covid) Nasopharyngeal Swab     Status: None   Collection Time: 12/28/20  8:56 PM   Specimen: Nasopharyngeal Swab; Nasopharyngeal(NP) swabs in vial transport medium  Result Value Ref Range Status   SARS Coronavirus 2 by RT PCR NEGATIVE NEGATIVE Final    Comment: (NOTE) SARS-CoV-2 target nucleic acids are NOT DETECTED.  The SARS-CoV-2 RNA is generally detectable in upper respiratory specimens during the acute phase of infection. The lowest concentration of SARS-CoV-2 viral copies this assay can detect is 138 copies/mL. A negative result does not preclude SARS-Cov-2 infection and should not be used as the sole basis for treatment or other patient management decisions. A negative result may occur  with  improper specimen collection/handling, submission of specimen other than nasopharyngeal swab, presence of viral mutation(s) within the areas targeted by this assay, and inadequate number of viral copies(<138 copies/mL). A negative result must be combined with clinical observations, patient history, and epidemiological information. The expected result is Negative.  Fact Sheet for Patients:  BloggerCourse.com  Fact Sheet for Healthcare Providers:  SeriousBroker.it  This test is no t yet approved or cleared by the [place] FDA and  has been authorized for detection and/or diagnosis of SARS-CoV-2 by FDA under an Emergency Use Authorization (EUA). This EUA will remain  in effect (meaning this test can be used) for the duration of the COVID-19 declaration under Section 564(b)(1) of the Act, 21 U.S.C.section 360bbb-3(b)(1), unless the authorization is terminated  or revoked sooner.       Influenza A by PCR NEGATIVE NEGATIVE Final    Influenza B by PCR NEGATIVE NEGATIVE Final    Comment: (NOTE) The Xpert Xpress SARS-CoV-2/FLU/RSV plus assay is intended as an aid in the diagnosis of influenza from Nasopharyngeal swab specimens and should not be used as a sole basis for treatment. Nasal washings and aspirates are unacceptable for Xpert Xpress SARS-CoV-2/FLU/RSV testing.  Fact Sheet for Patients: BloggerCourse.com  Fact Sheet for Healthcare Providers: SeriousBroker.it  This test is not yet approved or cleared by the [place] FDA and has been authorized for detection and/or diagnosis of SARS-CoV-2 by FDA under an Emergency Use Authorization (EUA). This EUA will remain in effect (meaning this test can be used) for the duration of the COVID-19 declaration under Section 564(b)(1) of the Act, 21 U.S.C. section 360bbb-3(b)(1), unless the authorization is terminated or revoked.  Performed at [facility], [address] [place], [place] [address]          Radiology Studies: DG Chest 2 View  Result Date: 12/28/2020 CLINICAL DATA:  Short of breath for 5 days EXAM: CHEST - 2 VIEW COMPARISON:  None. FINDINGS: Frontal and lateral views of the chest demonstrate an enlarged cardiac silhouette. There is increased central vascular congestion, with patchy areas of bibasilar consolidation and small effusions, greatest on the right. No pneumothorax. No acute bony abnormalities. IMPRESSION: 1. Findings consistent with mild congestive heart failure. Electronically Signed   By: Sharlet Salina M.D.   On: 12/28/2020 20:00   ECHOCARDIOGRAM COMPLETE  Result Date: 12/29/2020    ECHOCARDIOGRAM REPORT   Patient Name:   Jacqueline Ward Date of Exam: 12/29/2020 Medical Rec #:  [id number]      Height:       66.0 in Accession #:    [id number]     Weight:       340.0 lb Date of Birth:  1975-11-01      BSA:          2.506 m Patient Age:    45 years       BP:           164/137  mmHg Patient Gender: F              HR:           95 bpm. Exam Location:  Inpatient Procedure: 2D Echo, Cardiac Doppler and Color Doppler Indications:    CHF  History:        Patient has no prior history of Echocardiogram examinations.                 Signs/Symptoms:Shortness of Breath and LE edema; Risk  Factors:Morbid obesity, Diabetes and Hypertension.  Sonographer:    Lavenia AtlasBrooke Strickland Referring Phys: [id number] VASUNDHRA RATHORE  Sonographer Comments: Image acquisition challenging due to patient body habitus. IMPRESSIONS  1. Left ventricular ejection fraction, by estimation, is 20 to 25%. The left ventricle has severely decreased function. The left ventricle demonstrates global hypokinesis. The left ventricular internal cavity size was mildly dilated. There is mild left ventricular hypertrophy. Left ventricular diastolic parameters are consistent with Grade II diastolic dysfunction (pseudonormalization). Elevated left atrial pressure.  2. Right ventricular systolic function is moderately reduced. The right ventricular size is mildly enlarged. There is severely elevated pulmonary artery systolic pressure.  3. Left atrial size was mildly dilated.  4. Right atrial size was mildly dilated.  5. The mitral valve is normal in structure. Trivial mitral valve regurgitation. No evidence of mitral stenosis.  6. The aortic valve is tricuspid. Aortic valve regurgitation is trivial. No aortic stenosis is present.  7. The inferior vena cava is dilated in size with <50% respiratory variability, suggesting right atrial pressure of 15 mmHg. FINDINGS  Left Ventricle: Left ventricular ejection fraction, by estimation, is 20 to 25%. The left ventricle has severely decreased function. The left ventricle demonstrates global hypokinesis. The left ventricular internal cavity size was mildly dilated. There is mild left ventricular hypertrophy. Left ventricular diastolic parameters are consistent with Grade II diastolic  dysfunction (pseudonormalization). Elevated left atrial pressure. Right Ventricle: The right ventricular size is mildly enlarged.Right ventricular systolic function is moderately reduced. There is severely elevated pulmonary artery systolic pressure. The tricuspid regurgitant velocity is 3.39 m/s, and with an assumed right atrial pressure of 15 mmHg, the estimated right ventricular systolic pressure is 61.0 mmHg. Left Atrium: Left atrial size was mildly dilated. Right Atrium: Right atrial size was mildly dilated. Pericardium: There is no evidence of pericardial effusion. Mitral Valve: The mitral valve is normal in structure. Trivial mitral valve regurgitation. No evidence of mitral valve stenosis. Tricuspid Valve: The tricuspid valve is normal in structure. Tricuspid valve regurgitation is mild . No evidence of tricuspid stenosis. Aortic Valve: The aortic valve is tricuspid. Aortic valve regurgitation is trivial. No aortic stenosis is present. Pulmonic Valve: The pulmonic valve was normal in structure. Pulmonic valve regurgitation is trivial. No evidence of pulmonic stenosis. Aorta: The aortic root is normal in size and structure. Venous: The inferior vena cava is dilated in size with less than 50% respiratory variability, suggesting right atrial pressure of 15 mmHg. IAS/Shunts: No atrial level shunt detected by color flow Doppler. Additional Comments: There is a small pleural effusion in the left lateral region.  LEFT VENTRICLE PLAX 2D LVIDd:         5.50 cm  Diastology LVIDs:         5.20 cm  LV e' medial:    5.00 cm/s LV PW:         1.30 cm  LV E/e' medial:  22.0 LV IVS:        1.10 cm  LV e' lateral:   4.03 cm/s LVOT diam:     2.00 cm  LV E/e' lateral: 27.3 LV SV:         38 LV SV Index:   15 LVOT Area:     3.14 cm  RIGHT VENTRICLE RV Basal diam:  4.50 cm RV S prime:     5.11 cm/s TAPSE (M-mode): 1.3 cm LEFT ATRIUM           Index       RIGHT ATRIUM  Index LA diam:      4.00 cm 1.60 cm/m  RA Area:      24.90 cm LA Vol (A2C): 57.3 ml 22.87 ml/m RA Volume:   81.10 ml  32.37 ml/m LA Vol (A4C): 86.3 ml 34.44 ml/m  AORTIC VALVE LVOT Vmax:   76.90 cm/s LVOT Vmean:  46.500 cm/s LVOT VTI:    0.122 m  AORTA Ao Root diam: 2.90 cm MITRAL VALVE                TRICUSPID VALVE MV Area (PHT): 7.66 cm     TR Peak grad:   46.0 mmHg MV Decel Time: 99 msec      TR Vmax:        339.00 cm/s MV E velocity: 110.00 cm/s MV A velocity: 59.70 cm/s   SHUNTS MV E/A ratio:  1.84         Systemic VTI:  0.12 m                             Systemic Diam: 2.00 cm Olga Millers MD Electronically signed by Olga Millers MD Signature Date/Time: 12/29/2020/10:31:16 AM    Final         Scheduled Meds: . digoxin  0.125 mg Oral Daily  . furosemide  80 mg Intravenous Once  . insulin aspart  0-5 Units Subcutaneous QHS  . insulin aspart  0-9 Units Subcutaneous TID WC  . potassium chloride  40 mEq Oral Q4H  . potassium chloride  40 mEq Oral BID  . sacubitril-valsartan  1 tablet Oral BID  . sodium chloride flush  3 mL Intravenous Q12H  . spironolactone  25 mg Oral Daily   Continuous Infusions: . sodium chloride    . heparin 1,750 Units/hr (12/30/20 0821)  . magnesium sulfate bolus IVPB 2 g (12/30/20 0957)     LOS: 2 days    Time spent: Pleas Koch, MD [facility] 10:22 AM   12/30/2020, 10:15 AM

## 2020-12-30 NOTE — Progress Notes (Signed)
Per night shift nurse, patient couldn't lay flat yesterday to get VQ scan done. This morning patient, patient said she might be able to get test done now that she is feeling better, RN called [facility] but they are not here yet today due to the holiday. Will call later to see if they are available.

## 2020-12-30 NOTE — Consult Note (Addendum)
Advanced Heart Failure Team Consult Note   Primary Physician: Heather Roberts, NP PCP-Cardiologist:  None  Reason for Consultation: Acute biventricular heart failure   HPI:    Jacqueline Ward is seen today for evaluation of acute biventricular heart failure at the request of Dr. Flora Lipps, Cardiology.   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 BNP >3,439. D-dimer 6.27. CXR c/w mild CHF w/ cardiomegaly, increased vascular congestion and small effusions R>L. EKG showed sinus tach, 100 bpm w/ LAE and RAD. HS trop 26>>28. COVID and flu negative. WBC 7.1, Hgb 14.9, NA 140, K 4.0, SCr 1.1 (baseline 0.7), T bili 1.7, AST 24, ALT 23. HIV NR.   Echo showed severe biventricular failure. LVEF 20-25% w/ global HK, mild LVH, GIIDD. RV mildly enlarged w/ moderately reduced systolic function and severely elevated pulmonary artery pressure. RVSP 61.0 mmHg. Trival MR, mild TR. No effusion.   She was admitted and started IV Lasix. Diuresing well. Overall net negative 8.7L since admit. Wt down 16 lb. SCr stable, 1.00. K 3.1. GDMT initiated, on Westley Foots and digoxin. Cardiology following. Also on IV heparin and awaiting V/Q scan to r/o PE. LE venous dopplers ordered.   She is overall feeling better but still markedly fluid overloaded and unable to lay flat. No ischemic or pleuritic CP. Denies leg pain, no recent prolonged travel. No family h/o blood clots. No FH of CHF nor CAD in first degree relatives. She had COVID early 2020. No formal sleep study but she suspects she likely has OSA. Reported h/o snoring. Denies ETOH and tobacco use.     Echo 12/29/2020 1. Left ventricular ejection fraction, by estimation, is 20 to 25%. The  left ventricle has severely decreased function. The left ventricle  demonstrates global hypokinesis. The left ventricular internal cavity  size  was mildly dilated. There is mild left  ventricular hypertrophy. Left ventricular diastolic parameters are  consistent with Grade II diastolic dysfunction (pseudonormalization).  Elevated left atrial pressure.  2. Right ventricular systolic function is moderately reduced. The right  ventricular size is mildly enlarged. There is severely elevated pulmonary  artery systolic pressure.  3. Left atrial size was mildly dilated.  4. Right atrial size was mildly dilated.  5. The mitral valve is normal in structure. Trivial mitral valve  regurgitation. No evidence of mitral stenosis.  6. The aortic valve is tricuspid. Aortic valve regurgitation is trivial.  No aortic stenosis is present.  7. The inferior vena cava is dilated in size with <50% respiratory  variability, suggesting right atrial pressure of 15 mmHg.   Review of Systems: [y] = yes, [ ]  = no   . General: Weight gain [Y ]; Weight loss [ ] ; Anorexia [ ] ; Fatigue [ ] ; Fever [ ] ; Chills [ ] ; Weakness [ ]   . Cardiac: Chest pain/pressure [ ] ; Resting SOB [Y ]; Exertional SOB [ Y]; Orthopnea [Y ]; Pedal Edema [Y ]; Palpitations [ ] ; Syncope [ ] ; Presyncope [ ] ; Paroxysmal nocturnal dyspnea[Y ]  . Pulmonary: Cough [ ] ; Wheezing[ ] ; Hemoptysis[ ] ; Sputum [ ] ; Snoring [ Y]  . GI: Vomiting[ ] ; Dysphagia[ ] ; Melena[ ] ; Hematochezia [ ] ; Heartburn[ ] ; Abdominal pain [ ] ; Constipation [ ] ; Diarrhea [ ] ; BRBPR [ ]   . GU: Hematuria[ ] ; Dysuria [ ] ; Nocturia[ ]   .  Vascular: Pain in legs with walking [ ] ; Pain in feet with lying flat [ ] ; Non-healing sores [ ] ; Stroke [ ] ; TIA [ ] ; Slurred speech [ ] ;  . Neuro: Headaches[ Y]; Vertigo[ ] ; Seizures[ ] ; Paresthesias[ ] ;Blurred vision [ ] ; Diplopia [ ] ; Vision changes [ ]   . Ortho/Skin: Arthritis [ ] ; Joint pain [ ] ; Muscle pain [ ] ; Joint swelling [ ] ; Back Pain [ ] ; Rash [ ]   . Psych: Depression[ ] ; Anxiety[ ]   . Heme: Bleeding problems [ ] ; Clotting disorders [ ] ; Anemia [ ]   . Endocrine:  Diabetes [Y ]; Thyroid dysfunction[ ]   Home Medications Prior to Admission medications   Medication Sig Start Date End Date Taking? Authorizing Provider  albuterol (VENTOLIN HFA) 108 (90 Base) MCG/ACT inhaler Inhale 1-2 puffs into the lungs every 6 (six) hours as needed for wheezing or shortness of breath. 10/23/20  Yes Hagler, , MD  furosemide (LASIX) 80 MG tablet Take 1 tablet (80 mg total) by mouth 2 (two) times daily. 12/27/20  Yes , NP    Past Medical History: Past Medical History:  Diagnosis Date  . Diabetes mellitus without complication (HCC)   . Hypertension   . Obese   . Sialolithiasis of submandibular gland 2019    Past Surgical History: Past Surgical History:  Procedure Laterality Date  . LIPECTOMY  2004    Family History: Family History  Problem Relation Age of Onset  . Cancer Mother   . Hypertension Mother   . Pancreatitis Father   . Diabetes Father     Social History: Social History   Socioeconomic History  . Marital status: Single    Spouse name: Not on file  . Number of children: 0  . Years of education: Not on file  . Highest education level: Not on file  Occupational History  . Not on file  Tobacco Use  . Smoking status: Never Smoker  . Smokeless tobacco: Never Used  Vaping Use  . Vaping Use: Never used  Substance and Sexual Activity  . Alcohol use: No    Alcohol/week: 0.0 standard drinks  . Drug use: No  . Sexual activity: Not Currently  Other Topics Concern  . Not on file  Social History Narrative  . Not on file   Social Determinants of Health   Financial Resource Strain: Not on file  Food Insecurity: Not on file  Transportation Needs: Not on file  Physical Activity: Not on file  Stress: Not on file  Social Connections: Not on file    Allergies:  No Known Allergies  Objective:    Vital Signs:   Temp:  [97.6 F (36.4 C)-98.5 F (36.9 C)] 98.5 F (36.9 C) (05/30 0420) Pulse Rate:  [76-101] 99 (05/30  0420) Resp:  [18-32] 18 (05/30 0420) BP: (119-169)/(82-141) 139/91 (05/30 0420) SpO2:  [93 %-100 %] 95 % (05/30 0420) Weight:  [144.7 kg-147.3 kg] (P) 144.7 kg (05/30 0419) Last BM Date:  (pta)  Weight change: Filed Weights   12/28/20 1933 12/29/20 1720 12/30/20 0419  Weight: (!) 154.2 kg (!) 147.3 kg (!) (P) 144.7 kg    Intake/Output:   Intake/Output Summary (Last 24 hours) at 12/30/2020 0939 Last data filed at 12/30/2020 Gross per 24 hour  Intake 800.04 ml  Output 8550 ml  Net -7749.96 ml      Physical Exam    General:  Well appearing, obese female. No resp difficulty, ?malar rash on face  HEENT: normal Neck: supple.  JVP elevated to ear . Carotids 2+ bilat; no bruits. No lymphadenopathy or thyromegaly appreciated. Cor: PMI nondisplaced. Regular rhythm, mildly tachy rate. No rubs, gallops or murmurs. Lungs: decreased BS at the bases, no wheezing  Abdomen: obese, soft, nontender, nondistended. No hepatosplenomegaly. No bruits or masses. Good bowel sounds. Extremities: no cyanosis, clubbing, rash, 2+ bilateral pretibial pitting edema Neuro: alert & orientedx3, cranial nerves grossly intact. moves all 4 extremities w/o difficulty. Affect pleasant   Telemetry   Sinus tach, low 100s.   EKG    Sinus tach 100 bpm, RAD, LAE  Labs   Basic Metabolic Panel: Recent Labs  Lab 12/27/20 0908 12/28/20 2056 12/29/20 1904 12/30/20 0224  NA 141 140 140 141  K 4.2 4.0 3.3* 3.1*  CL 101 103 102 101  CO2 24 27 28 30   GLUCOSE 111* 105* 101* 126*  BUN 19 20 15 13   CREATININE 1.12* 1.15* 1.02* 1.00  CALCIUM 9.4 9.1 8.3* 8.3*    Liver Function Tests: Recent Labs  Lab 12/27/20 0908 12/28/20 2056 12/29/20 1904 12/30/20 0224  AST 24 32  --  32  ALT 21 23  --  23  ALKPHOS 144* 111  --  108  BILITOT 1.3* 1.7* 1.5* 1.5*  PROT 6.9 6.8  --  6.9  ALBUMIN 3.8 3.3*  --  2.9*   No results for input(s): LIPASE, AMYLASE in the last 168 hours. No results for input(s): AMMONIA  in the last 168 hours.  CBC: Recent Labs  Lab 12/27/20 0908 12/28/20 2056 12/29/20 0500 12/30/20 0224  WBC 5.7 7.1 6.6 5.8  NEUTROABS 3.7 4.6  --   --   HGB 14.3 14.9 13.7 13.8  HCT 45.1 47.0* 42.9 43.6  MCV 83 84.1 82.3 82.6  PLT 414 427* 367 382    Cardiac Enzymes: No results for input(s): CKTOTAL, CKMB, CKMBINDEX, TROPONINI in the last 168 hours.  BNP: BNP (last 3 results) Recent Labs    12/27/20 0908 12/28/20 11-22-54  BNP 3,439.9* 2,539.8*    ProBNP (last 3 results) No results for input(s): PROBNP in the last 8760 hours.   CBG: Recent Labs  Lab 12/29/20 0750 12/29/20 1255 12/29/20 1758 12/29/20 November 22, 2111 12/30/20 0614  GLUCAP 115* 126* 107* 130* 127*    Coagulation Studies: Recent Labs    12/28/20 11/22/2055  LABPROT 17.1*  INR 1.4*     Imaging    No results found.   Medications:     Current Medications: . digoxin  0.125 mg Oral Daily  . furosemide  80 mg Intravenous Once  . insulin aspart  0-5 Units Subcutaneous QHS  . insulin aspart  0-9 Units Subcutaneous TID WC  . potassium chloride  40 mEq Oral Q4H  . potassium chloride  40 mEq Oral BID  . sacubitril-valsartan  1 tablet Oral BID  . sodium chloride flush  3 mL Intravenous Q12H  . spironolactone  25 mg Oral Daily     Infusions: . sodium chloride    . heparin 1,750 Units/hr (12/30/20 0821)  . magnesium sulfate bolus IVPB          Assessment/Plan   1. Acute Biventricular Heart Failure - Echo: LVEF 20-25% w/ global HK, mild LVH, GIIDD. RV mildly enlarged w/ moderately reduced systolic function and severely elevated pulmonary artery pressure. RVSP 61.0 mmHg - Diuresing but still marked fluid overload. Continue IV Lasix, increase to 80 bid and supp K  - Increase Entresto to 97-103 bid - Continue Spiro 25 mg - Continue Digoxin  0.125 - No  blocker yet  - SGLT2i prior to d/c (Hgb A1c 7.4) - Will need R/LHC once better diuresed and able to lay flat. HS trop not c/w ACS  - Needs  outpatient sleep study   2. Pulmonary HTN - RVSP severely elevated on echo, 61 mmHG. RV mildly enlarged, systolic fx moderately reduced - Needs RHC after diuresis - V/Q scan to r/o PE. C/w heparin gtt until ruled out  - Serologies to r/o CTD. ? Lupus given facial rash  - Outpatient Sleep study to r/o OSA  - Outpatient PFTs. No tobacco history but ? Restriction given body habitus/ obesity   3. Type2DM - Hgb A1c 7.4  - insulin per primary  - SGLT2i soon   4. HTN - titrate GDMT per above - needs sleep study   5. Hypokalemia - K 3.1 - Supp w/ KCL - continue spiro and increase Entresto  - Check Mg level  6. Obesity Body mass index is 51.49 kg/m (pended).  - needs wt loss       Length of Stay: 2  Robbie Lis, PA-C  12/30/2020, 9:39 AM  Advanced Heart Failure Team Pager [phone] (M-F; 7a - 5p)  Please contact [facility] for night-coverage after hours (4p -7a ) and weekends on [email or web address]   Patient seen and examined with the above-signed Advanced Practice Provider and/or Housestaff. I personally reviewed laboratory data, imaging studies and relevant notes. I independently examined the patient and formulated the important aspects of the plan. I have edited the note to reflect any of my changes or salient points. I have personally discussed the plan with the patient and/or family.   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   Admitted with acute systolic HF LVEF 56-38% RV moderately reduced. Symptoms lasting 1-2 week.s/ Had URI about a month ago. No exertional CP   Trop normal. ECG sinus tach no st-ts. D-dimer 6  General:  Sitting up in bed  No resp difficulty HEENT: normal Neck: supple. JVP to ear . Carotids 2+ bilat; no bruits. No lymphadenopathy or thryomegaly appreciated. Cor: PMI nondisplaced. Regular tachy . No rubs, gallops or murmurs. Lungs: clear Abdomen: obese soft, nontender, nondistended.  No hepatosplenomegaly. No bruits or masses. Good bowel sounds. Extremities: no cyanosis, clubbing, rash, 2+ edema Neuro: alert & orientedx3, cranial nerves grossly intact. moves all 4 extremities w/o difficulty. Affect pleasant  Suspect NICM. My first inclination was HTN CM but SBPs mostly 140s at home. ?OSA.   Remains volume overloaded. Continue current regimen. Place PICC and UNNA boots. Possible R/L cath depending on course.   Arvilla Meres, MD  12:23 PM

## 2020-12-30 NOTE — Progress Notes (Signed)
Peripherally Inserted Central Catheter Placement  The IV Nurse has discussed with the patient and/or persons authorized to consent for the patient, the purpose of this procedure and the potential benefits and risks involved with this procedure.  The benefits include less needle sticks, lab draws from the catheter, and the patient may be discharged home with the catheter. Risks include, but not limited to, infection, bleeding, blood clot (thrombus formation), and puncture of an artery; nerve damage and irregular heartbeat and possibility to perform a PICC exchange if needed/ordered by physician.  Alternatives to this procedure were also discussed.  Bard Power PICC patient education guide, fact sheet on infection prevention and patient information card has been provided to patient /or left at bedside.    PICC Placement Documentation  PICC Double Lumen 12/30/20 PICC Right Brachial 38 cm 2 cm (Active)  Indication for Insertion or Continuance of Line Vasoactive infusions 12/30/20 1438  Exposed Catheter (cm) 0 cm 12/30/20 1438  Site Assessment Clean;Dry;Intact 12/30/20 1438  Lumen #1 Status Flushed;Blood return noted;Saline locked 12/30/20 1438  Lumen #2 Status Flushed;Blood return noted;Saline locked 12/30/20 1438  Dressing Type Transparent 12/30/20 1438  Dressing Status Clean;Dry;Intact 12/30/20 1438  Antimicrobial disc in place? Yes 12/30/20 1438  Dressing Change Due 01/06/21 12/30/20 1438       Jacqueline Ward, Westley Gambles Ramos 12/30/2020, 2:40 PM

## 2020-12-30 NOTE — Progress Notes (Signed)
Attempted bilateral lower extremity venous duplex 4x today, patient unavailable every time. Latest attempt, pt with lab. We will attempt again 12/31/20.  12/30/2020 4:11 PM Eula Fried., MHA, RVT, RDCS, RDMS

## 2020-12-30 NOTE — Progress Notes (Signed)
ANTICOAGULATION CONSULT NOTE - Follow Up Consult  Pharmacy Consult for Heparin Indication: pulmonary embolus  No Known Allergies  Patient Measurements: Height: 5\' 6"  (167.6 cm) Weight: (!) (P) 144.7 kg (319 lb 0.1 oz) IBW/kg (Calculated) : 59.3 Heparin Dosing Weight: 98 kg  Vital Signs: Temp: 98.5 F (36.9 C) (05/30 0420) Temp Source: Oral (05/30 0420) BP: 139/91 (05/30 0420) Pulse Rate: 99 (05/30 0420)  Labs: Recent Labs    12/28/20 2056 12/28/20 2057 12/29/20 0500 12/29/20 0630 12/29/20 0758 12/29/20 1730 12/29/20 1904 12/30/20 0224  HGB 14.9  --  13.7  --   --   --   --  13.8  HCT 47.0*  --  42.9  --   --   --   --  43.6  PLT 427*  --  367  --   --   --   --  382  APTT  --  30  --   --   --   --   --   --   LABPROT  --  17.1*  --   --   --   --   --   --   INR  --  1.4*  --   --   --   --   --   --   HEPARINUNFRC  --   --   --  <0.10*  --  0.49  --  0.46  CREATININE 1.15*  --   --   --   --   --  1.02* 1.00  TROPONINIHS 26*  --   --   --  28*  --   --   --     Estimated Creatinine Clearance: 103.8 mL/min (by C-G formula based on SCr of 1 mg/dL).   Medications:  Infusions:  . sodium chloride    . heparin 1,750 Units/hr (12/29/20 12/31/20)    Assessment: 45 year old female with a history of obesity, DM type II, hypertension presents to the ER with complaints of shortness of breath x1 week. Heparin per Rx for rule out PE.  No prior AC noted.  Baseline aPTT 30, INR 1.4, Ddimer 6.27.  VQ scan and bilateral lower extremity Dopplers have been ordered.  Heparin level remains therapeutic at 0.46, on 1750 units/hr. No s/sx of bleeding or infusion issues per RN. CBC stable this morning.  Goal of Therapy:  Heparin level 0.3-0.7 units/ml Monitor platelets by anticoagulation protocol: Yes   Plan:  Continue heparin IV infusion at 1750 units/hr Daily heparin level and CBC Continue to monitor H&H and platelets Follow up VQ scan, dopplers.  55, PharmD PGY1  Pharmacy Resident 12/30/2020 7:44 AM  Please check AMION for all [facility] Pharmacy phone numbers After 10:00 PM, call Main Pharmacy [phone]

## 2020-12-31 ENCOUNTER — Other Ambulatory Visit (HOSPITAL_COMMUNITY): Payer: Self-pay

## 2020-12-31 ENCOUNTER — Inpatient Hospital Stay (HOSPITAL_COMMUNITY): Payer: No Typology Code available for payment source

## 2020-12-31 ENCOUNTER — Inpatient Hospital Stay: Payer: Self-pay

## 2020-12-31 DIAGNOSIS — R609 Edema, unspecified: Secondary | ICD-10-CM

## 2020-12-31 DIAGNOSIS — I5021 Acute systolic (congestive) heart failure: Secondary | ICD-10-CM | POA: Diagnosis not present

## 2020-12-31 LAB — COMPREHENSIVE METABOLIC PANEL
ALT: 20 U/L (ref 0–44)
AST: 25 U/L (ref 15–41)
Albumin: 2.8 g/dL — ABNORMAL LOW (ref 3.5–5.0)
Alkaline Phosphatase: 94 U/L (ref 38–126)
Anion gap: 9 (ref 5–15)
BUN: 12 mg/dL (ref 6–20)
CO2: 33 mmol/L — ABNORMAL HIGH (ref 22–32)
Calcium: 8.3 mg/dL — ABNORMAL LOW (ref 8.9–10.3)
Chloride: 98 mmol/L (ref 98–111)
Creatinine, Ser: 0.94 mg/dL (ref 0.44–1.00)
GFR, Estimated: >60 mL/min (ref >60)
Glucose, Bld: 121 mg/dL — ABNORMAL HIGH (ref 70–99)
Potassium: 3.5 mmol/L (ref 3.5–5.1)
Sodium: 140 mmol/L (ref 135–145)
Total Bilirubin: 1.1 mg/dL (ref 0.3–1.2)
Total Protein: 6.5 g/dL (ref 6.5–8.1)

## 2020-12-31 LAB — GLUCOSE, CAPILLARY
Glucose-Capillary: 121 mg/dL — ABNORMAL HIGH (ref 70–99)
Glucose-Capillary: 108 mg/dL — ABNORMAL HIGH (ref 70–99)
Glucose-Capillary: 129 mg/dL — ABNORMAL HIGH (ref 70–99)
Glucose-Capillary: 124 mg/dL — ABNORMAL HIGH (ref 70–99)

## 2020-12-31 LAB — COOXEMETRY PANEL
Carboxyhemoglobin: 1.4 % (ref 0.5–1.5)
Methemoglobin: 0.8 % (ref 0.0–1.5)
O2 Saturation: 68.5 %
Total hemoglobin: 13.2 g/dL (ref 12.0–16.0)

## 2020-12-31 LAB — CBC
HCT: 41.1 % (ref 36.0–46.0)
Hemoglobin: 13 g/dL (ref 12.0–15.0)
MCH: 26.3 pg (ref 26.0–34.0)
MCHC: 31.6 g/dL (ref 30.0–36.0)
MCV: 83.2 fL (ref 80.0–100.0)
Platelets: 331 10*3/uL (ref 150–400)
RBC: 4.94 MIL/uL (ref 3.87–5.11)
RDW: 18.9 % — ABNORMAL HIGH (ref 11.5–15.5)
WBC: 6.3 10*3/uL (ref 4.0–10.5)
nRBC: 0 % (ref 0.0–0.2)

## 2020-12-31 LAB — HEPARIN LEVEL (UNFRACTIONATED): Heparin Unfractionated: 0.5 IU/mL (ref 0.30–0.70)

## 2020-12-31 LAB — MAGNESIUM: Magnesium: 1.8 mg/dL (ref 1.7–2.4)

## 2020-12-31 MED ORDER — POTASSIUM CHLORIDE CRYS ER 20 MEQ PO TBCR
40.0000 meq | EXTENDED_RELEASE_TABLET | Freq: Once | ORAL | Status: AC
  Administered 2020-12-31: 40 meq via ORAL
  Filled 2020-12-31: qty 2

## 2020-12-31 MED ORDER — IBUPROFEN 200 MG PO TABS
400.0000 mg | ORAL_TABLET | Freq: Once | ORAL | Status: AC
  Administered 2020-12-31: 400 mg via ORAL
  Filled 2020-12-31: qty 2

## 2020-12-31 MED ORDER — SACUBITRIL-VALSARTAN 97-103 MG PO TABS
1.0000 | ORAL_TABLET | Freq: Two times a day (BID) | ORAL | Status: DC
  Administered 2020-12-31 – 2021-01-03 (×7): 1 via ORAL
  Filled 2020-12-31 (×8): qty 1

## 2020-12-31 MED ORDER — INSULIN ASPART 100 UNIT/ML IJ SOLN: 0.0000 [IU] | Freq: Three times a day (TID) | INTRAMUSCULAR | Status: DC

## 2020-12-31 MED ORDER — ISOSORB DINITRATE-HYDRALAZINE 20-37.5 MG PO TABS
1.0000 | ORAL_TABLET | Freq: Three times a day (TID) | ORAL | Status: DC
  Administered 2020-12-31 – 2021-01-02 (×7): 1 via ORAL
  Filled 2020-12-31 (×7): qty 1

## 2020-12-31 MED ORDER — FUROSEMIDE 10 MG/ML IJ SOLN
80.0000 mg | Freq: Three times a day (TID) | INTRAMUSCULAR | Status: AC
  Administered 2020-12-31 – 2021-01-01 (×5): 80 mg via INTRAVENOUS
  Filled 2020-12-31 (×5): qty 8

## 2020-12-31 MED ORDER — ONDANSETRON HCL 4 MG/2ML IJ SOLN
4.0000 mg | Freq: Four times a day (QID) | INTRAMUSCULAR | Status: DC | PRN
  Administered 2020-12-31: 4 mg via INTRAVENOUS
  Filled 2020-12-31: qty 2

## 2020-12-31 MED ORDER — MAGNESIUM SULFATE 2 GM/50ML IV SOLN
2.0000 g | Freq: Once | INTRAVENOUS | Status: AC
  Administered 2020-12-31: 2 g via INTRAVENOUS
  Filled 2020-12-31: qty 50

## 2020-12-31 MED ORDER — TECHNETIUM TO 99M ALBUMIN AGGREGATED
4.1000 | Freq: Once | INTRAVENOUS | Status: AC | PRN
  Administered 2020-12-31: 4.1 via INTRAVENOUS

## 2020-12-31 NOTE — Progress Notes (Addendum)
MD made NCM aware:  we will get a nighttime O2 sat-would you please ensure that we look for agencies that can use the desat screen as a qualification for BiPAP? Thank you Referral made with [employer] . Per Ian Malkin with [employer]: Unfortunately insurance will not approve with an ONO unless they have COPD or an extrinsic restrictive thoracic cagre abnormality. OSH can be used with a bedside spirometry showing FEV1/FVC > 60 and  an  ABG showing> 44 PCO2 and then a sleep study  or ABG while asleep or upon waking with abnormal PCO2  7  Greater than the  Initial blood gas. Gae Gallop RN,BSN,CM [phone]

## 2020-12-31 NOTE — Progress Notes (Signed)
BLE venous duplex has been completed.  Preliminary results given to Humphrey, RN.  Results can be found under chart review under CV PROC. 12/31/2020 2:48 PM Marzella Miracle RVT, RDMS

## 2020-12-31 NOTE — Progress Notes (Signed)
Orthopedic Tech Progress Note Patient Details:  Jacqueline Ward 07/21/1976 [id number]  Ortho Devices Type of Ortho Device: Roland Rack boot Ortho Device/Splint Location: BLE Ortho Device/Splint Interventions: Ordered,Application,Adjustment   Post Interventions Patient Tolerated: Well Instructions Provided: Care of device   Donald Pore 12/31/2020, 6:36 PM

## 2020-12-31 NOTE — Progress Notes (Signed)
ANTICOAGULATION CONSULT NOTE - Follow Up Consult  Pharmacy Consult for Heparin Indication: pulmonary embolus  No Known Allergies  Patient Measurements: Height: 5\' 6"  (167.6 cm) Weight: (!) 146.8 kg (323 lb 10.2 oz) IBW/kg (Calculated) : 59.3 Heparin Dosing Weight: 98 kg  Vital Signs: Temp: 98.4 F (36.9 C) (05/31 0421) Temp Source: Oral (05/31 0421) BP: 175/106 (05/31 0421)  Labs: Recent Labs    12/28/20 2056 12/28/20 2057 12/29/20 0500 12/29/20 0630 12/29/20 0758 12/29/20 1730 12/29/20 1904 12/30/20 0224 12/30/20 1541 12/31/20 0431  HGB 14.9  --  13.7  --   --   --   --  13.8  --  13.0  HCT 47.0*  --  42.9  --   --   --   --  43.6  --  41.1  PLT 427*  --  367  --   --   --   --  382  --  331  APTT  --  30  --   --   --   --   --   --   --   --   LABPROT  --  17.1*  --   --   --   --   --   --   --   --   INR  --  1.4*  --   --   --   --   --   --   --   --   HEPARINUNFRC  --   --   --    < >  --  0.49  --  0.46  --  0.50  CREATININE 1.15*  --   --   --   --   --    < > 1.00 1.02* 0.94  TROPONINIHS 26*  --   --   --  28*  --   --   --   --   --    < > = values in this interval not displayed.    Estimated Creatinine Clearance: 110.1 mL/min (by C-G formula based on SCr of 0.94 mg/dL).   Medications:  Infusions:  . sodium chloride    . heparin 1,750 Units/hr (12/30/20 2346)    Assessment: 45 year old female with a history of obesity, DM type II, hypertension presents to the ER with complaints of shortness of breath x1 week. Heparin per Rx for rule out PE.  No prior AC noted.  Baseline aPTT 30, INR 1.4, Ddimer 6.27.  VQ scan and bilateral lower extremity Dopplers have been ordered.  Heparin level remains therapeutic at 0.5, on 1750 units/hr. No s/sx of bleeding or infusion issues per RN. CBC stable.  Goal of Therapy:  Heparin level 0.3-0.7 units/ml Monitor platelets by anticoagulation protocol: Yes   Plan:  Continue heparin IV infusion at 1750  units/hr Daily heparin level and CBC Continue to monitor H&H and platelets Follow up VQ scan, dopplers.  55, PharmD, BCCCP Clinical Pharmacist  Phone: [phone] 12/31/2020 7:51 AM  Please check AMION for all [facility] Pharmacy phone numbers After 10:00 PM, call Main Pharmacy [phone]

## 2020-12-31 NOTE — Progress Notes (Signed)
I didn't see any CXR results, but BNP is elevated, hopefully the lasix will help you feel better fast. A1c is 7.4. I see that you went to the hospital and EF is 20-25%, so CHF is the issue. The team at [facility] is great, so do whatever they tell you to do, and we will meet up when you get out.

## 2020-12-31 NOTE — TOC Benefit Eligibility Note (Signed)
Patient Product/process development scientist completed.    The patient is currently admitted and upon discharge could be taking Bidil .  The current 30 day co-pay is, $100.00.   The patient is currently admitted and upon discharge could be taking Entresto 24/26mg  .  The current 30 day co-pay is, $116.24.  The patient is currently admitted and upon discharge could be taking Farxiga 10 .  The current 30 day co-pay is, $15.00.   The patient is currently admitted and upon discharge could be taking Jardiance 10 mg .  The current 30 day co-pay is, $43.01.    The patient is insured through [employer]    Jacqueline Ward, CPhT Pharmacy Patient Advocate Specialist [facility] Health Antimicrobial Stewardship Team Direct Number: [phone]  Fax: [phone]

## 2020-12-31 NOTE — Progress Notes (Signed)
Advanced Heart Failure Rounding Note   Subjective:    Didn't sleep well. + orthopnea. No CP   Did not get VQ as she was peeing too much  Remains on heparin. No bleeding  BP high .  CVP 20. Co-ox 69% On IV lasix. Output 2.5 L weight only down 1 pound  UNNA boot ordered but not placed yet    Objective:   Weight Range:  Vital Signs:   Temp:  [98.4 F (36.9 C)-98.7 F (37.1 C)] 98.4 F (36.9 C) (05/31 0421) Pulse Rate:  [106] 106 (05/30 1115) Resp:  [17-20] 17 (05/31 0421) BP: (144-175)/(104-108) 175/106 (05/31 0421) SpO2:  [90 %-97 %] 97 % (05/31 0421) Weight:  [146.8 kg] 146.8 kg (05/31 0421) Last BM Date: 12/29/20  Weight change: Filed Weights   12/29/20 1720 12/30/20 0419 12/31/20 0421  Weight: (!) 147.3 kg (!) (P) 144.7 kg (!) 146.8 kg    Intake/Output:   Intake/Output Summary (Last 24 hours) at 12/31/2020 0709 Last data filed at 12/31/2020 0100 Gross per 24 hour  Intake 930 ml  Output 2450 ml  Net -1520 ml     Physical Exam: General:  Sitting up in bed. No resp difficulty HEENT: normal Neck: supple. JVP to ear. Carotids 2+ bilat; no bruits. No lymphadenopathy or thryomegaly appreciated. Cor: PMI nondisplaced. Regular tachy  Lungs: clear Abdomen: obese soft, nontender, nondistended. No hepatosplenomegaly. No bruits or masses. Good bowel sounds. Extremities: no cyanosis, clubbing, rash, 2-3+ edema Neuro: alert & orientedx3, cranial nerves grossly intact. moves all 4 extremities w/o difficulty. Affect pleasant  Telemetry: Sinus tach 100-110 Personally reviewed   Labs: Basic Metabolic Panel: Recent Labs  Lab 12/28/20 2056 12/29/20 1904 12/30/20 0224 12/30/20 1541 12/31/20 0431  NA 140 140 141 140 140  K 4.0 3.3* 3.1* 3.5 3.5  CL 103 102 101 102 98  CO2 27 28 30 30  33*  GLUCOSE 105* 101* 126* 127* 121*  BUN 20 15 13 13 12   CREATININE 1.15* 1.02* 1.00 1.02* 0.94  CALCIUM 9.1 8.3* 8.3* 8.2* 8.3*  MG  --   --  1.6*  --   --     Liver  Function Tests: Recent Labs  Lab 12/27/20 0908 12/28/20 2056 12/29/20 1904 12/30/20 0224 12/31/20 0431  AST 24 32  --  32 25  ALT 21 23  --  23 20  ALKPHOS 144* 111  --  108 94  BILITOT 1.3* 1.7* 1.5* 1.5* 1.1  PROT 6.9 6.8  --  6.9 6.5  ALBUMIN 3.8 3.3*  --  2.9* 2.8*   No results for input(s): LIPASE, AMYLASE in the last 168 hours. No results for input(s): AMMONIA in the last 168 hours.  CBC: Recent Labs  Lab 12/27/20 0908 12/28/20 2056 12/29/20 0500 12/30/20 0224 12/31/20 0431  WBC 5.7 7.1 6.6 5.8 6.3  NEUTROABS 3.7 4.6  --   --   --   HGB 14.3 14.9 13.7 13.8 13.0  HCT 45.1 47.0* 42.9 43.6 41.1  MCV 83 84.1 82.3 82.6 83.2  PLT 414 427* 367 382 331    Cardiac Enzymes: No results for input(s): CKTOTAL, CKMB, CKMBINDEX, TROPONINI in the last 168 hours.  BNP: BNP (last 3 results) Recent Labs    12/27/20 0908 12/28/20 2056  BNP 3,439.9* 2,539.8*    ProBNP (last 3 results) No results for input(s): PROBNP in the last 8760 hours.    Other results:  Imaging: DG CHEST PORT 1 VIEW  Result Date: 12/30/2020 CLINICAL  DATA:  Preop for V/Q scan, evaluate PICC line placement EXAM: PORTABLE CHEST 1 VIEW COMPARISON:  12/28/2020 FINDINGS: Right-sided PICC line is seen with the catheter tip in the proximal superior vena cava. Increasing right basilar opacity is seen when compare with the previous day. Mild vascular congestion remains. IMPRESSION: Stable vascular congestion. PICC line in the proximal superior vena cava as described. Increasing right basilar opacity. Electronically Signed   By: Alcide Clever M.D.   On: 12/30/2020 15:45   ECHOCARDIOGRAM COMPLETE  Result Date: 12/29/2020    ECHOCARDIOGRAM REPORT   Patient Name:   Jacqueline Ward Date of Exam: 12/29/2020 Medical Rec #:  [id number]      Height:       66.0 in Accession #:    [id number]     Weight:       340.0 lb Date of Birth:  1976-04-10      BSA:          2.506 m Patient Age:    45 years       BP:           164/137  mmHg Patient Gender: F              HR:           95 bpm. Exam Location:  Inpatient Procedure: 2D Echo, Cardiac Doppler and Color Doppler Indications:    CHF  History:        Patient has no prior history of Echocardiogram examinations.                 Signs/Symptoms:Shortness of Breath and LE edema; Risk                 Factors:Morbid obesity, Diabetes and Hypertension.  Sonographer:    Lavenia Atlas Referring Phys: [id number] VASUNDHRA RATHORE  Sonographer Comments: Image acquisition challenging due to patient body habitus. IMPRESSIONS  1. Left ventricular ejection fraction, by estimation, is 20 to 25%. The left ventricle has severely decreased function. The left ventricle demonstrates global hypokinesis. The left ventricular internal cavity size was mildly dilated. There is mild left ventricular hypertrophy. Left ventricular diastolic parameters are consistent with Grade II diastolic dysfunction (pseudonormalization). Elevated left atrial pressure.  2. Right ventricular systolic function is moderately reduced. The right ventricular size is mildly enlarged. There is severely elevated pulmonary artery systolic pressure.  3. Left atrial size was mildly dilated.  4. Right atrial size was mildly dilated.  5. The mitral valve is normal in structure. Trivial mitral valve regurgitation. No evidence of mitral stenosis.  6. The aortic valve is tricuspid. Aortic valve regurgitation is trivial. No aortic stenosis is present.  7. The inferior vena cava is dilated in size with <50% respiratory variability, suggesting right atrial pressure of 15 mmHg. FINDINGS  Left Ventricle: Left ventricular ejection fraction, by estimation, is 20 to 25%. The left ventricle has severely decreased function. The left ventricle demonstrates global hypokinesis. The left ventricular internal cavity size was mildly dilated. There is mild left ventricular hypertrophy. Left ventricular diastolic parameters are consistent with Grade II diastolic  dysfunction (pseudonormalization). Elevated left atrial pressure. Right Ventricle: The right ventricular size is mildly enlarged.Right ventricular systolic function is moderately reduced. There is severely elevated pulmonary artery systolic pressure. The tricuspid regurgitant velocity is 3.39 m/s, and with an assumed right atrial pressure of 15 mmHg, the estimated right ventricular systolic pressure is 61.0 mmHg. Left Atrium: Left atrial size was mildly dilated. Right Atrium: Right atrial size was  mildly dilated. Pericardium: There is no evidence of pericardial effusion. Mitral Valve: The mitral valve is normal in structure. Trivial mitral valve regurgitation. No evidence of mitral valve stenosis. Tricuspid Valve: The tricuspid valve is normal in structure. Tricuspid valve regurgitation is mild . No evidence of tricuspid stenosis. Aortic Valve: The aortic valve is tricuspid. Aortic valve regurgitation is trivial. No aortic stenosis is present. Pulmonic Valve: The pulmonic valve was normal in structure. Pulmonic valve regurgitation is trivial. No evidence of pulmonic stenosis. Aorta: The aortic root is normal in size and structure. Venous: The inferior vena cava is dilated in size with less than 50% respiratory variability, suggesting right atrial pressure of 15 mmHg. IAS/Shunts: No atrial level shunt detected by color flow Doppler. Additional Comments: There is a small pleural effusion in the left lateral region.  LEFT VENTRICLE PLAX 2D LVIDd:         5.50 cm  Diastology LVIDs:         5.20 cm  LV e' medial:    5.00 cm/s LV PW:         1.30 cm  LV E/e' medial:  22.0 LV IVS:        1.10 cm  LV e' lateral:   4.03 cm/s LVOT diam:     2.00 cm  LV E/e' lateral: 27.3 LV SV:         38 LV SV Index:   15 LVOT Area:     3.14 cm  RIGHT VENTRICLE RV Basal diam:  4.50 cm RV S prime:     5.11 cm/s TAPSE (M-mode): 1.3 cm LEFT ATRIUM           Index       RIGHT ATRIUM           Index LA diam:      4.00 cm 1.60 cm/m  RA Area:      24.90 cm LA Vol (A2C): 57.3 ml 22.87 ml/m RA Volume:   81.10 ml  32.37 ml/m LA Vol (A4C): 86.3 ml 34.44 ml/m  AORTIC VALVE LVOT Vmax:   76.90 cm/s LVOT Vmean:  46.500 cm/s LVOT VTI:    0.122 m  AORTA Ao Root diam: 2.90 cm MITRAL VALVE                TRICUSPID VALVE MV Area (PHT): 7.66 cm     TR Peak grad:   46.0 mmHg MV Decel Time: 99 msec      TR Vmax:        339.00 cm/s MV E velocity: 110.00 cm/s MV A velocity: 59.70 cm/s   SHUNTS MV E/A ratio:  1.84         Systemic VTI:  0.12 m                             Systemic Diam: 2.00 cm Olga Millers MD Electronically signed by Olga Millers MD Signature Date/Time: 12/29/2020/10:31:16 AM    Final    [place] EKG SITE RITE  Result Date: 12/30/2020 If Site Rite image not attached, placement could not be confirmed due to current cardiac rhythm.     Medications:     Scheduled Medications: . Chlorhexidine Gluconate Cloth  6 each Topical Daily  . digoxin  0.125 mg Oral Daily  . insulin aspart  0-5 Units Subcutaneous QHS  . insulin aspart  0-9 Units Subcutaneous TID WC  . potassium chloride  40 mEq Oral BID  .  sacubitril-valsartan  1 tablet Oral BID  . sodium chloride flush  3 mL Intravenous Q12H  . spironolactone  25 mg Oral Daily     Infusions: . sodium chloride    . heparin 1,750 Units/hr (12/30/20 2346)     PRN Medications:  sodium chloride, acetaminophen, alum & mag hydroxide-simeth, hydrALAZINE, sodium chloride flush, sodium chloride flush   Assessment/Plan:   1. Acute Biventricular Heart Failure - Echo: LVEF 20-25% w/ global HK, mild LVH, GIIDD. RV mildly enlarged w/ moderately reduced systolic function and severely elevated pulmonary artery pressure. RVSP 61.0 mmHg - Diuresing but still marked fluid overload. Continue IV Lasix, increase to 80 tid and supp K May need metolazone - Continue Entresto to 97-103 bid - Continue Spiro 25 mg - Continue Digoxin 0.125 - No ? blocker yet  - SGLT2i prior to d/c (Hgb A1c 7.4) - Suspect HTN  CM. Can consider R/L cath prior to d/c  - Needs outpatient sleep study  - Await UNNA boots  2. Pulmonary HTN - RVSP severely elevated on echo, 61 mmHG. RV mildly enlarged, systolic fx moderately reduced - Needs RHC after diuresis - V/Q scan to r/o PE. C/w heparin gtt until ruled out (low clinical suspicion) - Serologies to r/o CTD.  - Outpatient Sleep study to r/o OSA  - Outpatient PFTs. No tobacco history but ? Restriction given body habitus/ obesity   3. Type2DM - Hgb A1c 7.4  - insulin per primary  - SGLT2i soon   4. HTN - titrate GDMT per above - needs sleep study   5. Hypokalemia - K 3.5 - Supp w/ KCL - continue spiro and Entresto   6. Obesity Body mass index is 51.49 kg/m (pended).  - needs wt loss    Length of Stay: 3   Jacqueline Cappelletti MD 12/31/2020, 7:09 AM  Advanced Heart Failure Team Pager [phone] (M-F; 7a - 4p)  Please contact [facility] for night-coverage after hours (4p -7a ) and weekends on [email or web address]

## 2020-12-31 NOTE — Consult Note (Signed)
   [facility] Inpatient Consult   12/31/2020  Jacqueline Ward May 04, 1976 [id number]    [place] [facility] [ACO] Patient: [name] plan  Patient is currently assigned with [facility] post hospital transition of care with a [place] Coordinator for the [place].  Patient will receive a post hospital call and will be evaluated for assessments and disease process education and plan assistance support as needed.   Post follow up needs discussed in unit progression meeting regarding possible CPAP needs for post hospital. Information given to [facility] ,inpatient  TOC.  Met patient at bedside and explained [facility] RN involvement for post hospital follow up and support.   Plan: Patient will be followed by [place] Coordinator.   For additional questions or referrals please contact:   [name], RN BSN [facility] Liaison  [phone] business mobile phone Toll free office [phone]  Fax number: [phone] [email or web address]

## 2020-12-31 NOTE — Progress Notes (Signed)
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 Office visit 5/27 indicates 54 pound weight gain in 2 months-very active lady works out 3 times a week previously followed by bariatrics at [facility] with some good improvement -In the ER, creatinine was 1.12, BNP was 3439, high-sensitivity troponin was 28, Urinalysis was negative for microalbuminuria. -Diuresed with good response, EF of 20-25%, global hypokinesis, mild LVH, grade 2 diastolic dysfunction, RV systolic function is moderately reduced, severe elevated PASP 5/29-transferred to [facility] for cardiology evaluation  Assessment & Plan:   Acute systolic CHF PU L HTN PASP 61 Admitted with significant volume overload, anasarca Echo : EF of 20-25%, global hypokinesis, mild LVH, grade 2 diastolic dysfunction, RV systolic function is moderately reduced, severe elevated PASP Planning as per cardiology-?  Cardiac cath 6/1 current meds: Digoxin 0.125, Entresto 1 tab twice daily, Aldactone 25 -I/O: -13.9 L, WG Ht: Inaccurate-needs standing weight -Currently Lasix 80 iii/day IV, BiDil started 5/31 -?  R heart cath per cardiology  Elevated D-dimer Clinical suspicion more concerning heart failure >PE Await V/Q, await duplex lower extremities Nursing made aware to follow-up  Elevated troponin Minimally elevated high-sensitivity troponin,  EKG without acute findings Secondary probably to heart failure  Uncontrolled hypertension Deferring to cardiology  Type 2 diabetes mellitus Hemoglobin A1c is 7.4, continue sensitive sliding scale Needs SGLT 2 inhibitor prior to discharge per cardiologist defer  planning to them  Obesity-previously followed at [facility] with good result OHSS-tells me she snores Epworth scale is pan positive D/W case manager with regards to qualification for home CPAP  Mild hyperbilirubinemia Etiology unclear LFTs otherwise normal  Mild hypokalemia Replace potassium, check a.m.  DVT prophylaxis: Heparin Code Status: Full code Family Communication: Discussed patient in detail, no family at bedside Disposition Plan:  Status is: Inpatient  Remains inpatient appropriate because:Inpatient level of care appropriate due to severity of illness   Dispo: The patient is from: Home              Anticipated d/c is to: Home              Patient currently is not medically stable to d/c.   Difficult to place patient No  Consultants:   Heart failure team   Procedures:   Antimicrobials:    Subjective:  Comfortable Urinating profusely No distress Less short of breath Still has not gotten duplex or VQ scan No chest pain at this time Sister at bedside-  Objective: Vitals:   12/31/20 0300 12/31/20 0400 12/31/20 0421 12/31/20 1125  BP:   (!) 175/106 (!) 131/91  Pulse:    (!) 102  Resp:   17 18  Temp:   98.4 F (36.9 C) 98.3 F (36.8 C)  TempSrc:   Oral Oral  SpO2: 95% 90% 97% 95%  Weight:   (!) 146.8 kg   Height:        Intake/Output Summary (Last 24 hours) at 12/31/2020 1322 Last data filed at 12/31/2020 1131 Gross per 24 hour  Intake 660 ml  Output 5850 ml  Net -5190 ml   Filed Weights   12/29/20 1720 12/30/20 0419 12/31/20 0421  Weight: Marland Kitchen)  147.3 kg (!) (P) 144.7 kg (!) 146.8 kg    Examination:  EOMI NCAT Mallampati 4 No rales or rhonchi PICC line in right arm Abdomen obese nontender no rebound cannot appreciate organomegaly Lower extremity edema++ Neurologically intact    Data Reviewed:   CBC: Recent Labs  Lab 12/27/20 0908 12/28/20 2056 12/29/20 0500 12/30/20 0224 12/31/20 0431  WBC 5.7 7.1 6.6 5.8 6.3  NEUTROABS  3.7 4.6  --   --   --   HGB 14.3 14.9 13.7 13.8 13.0  HCT 45.1 47.0* 42.9 43.6 41.1  MCV 83 84.1 82.3 82.6 83.2  PLT 414 427* 367 382 331   Basic Metabolic Panel: Recent Labs  Lab 12/28/20 2056 12/29/20 1904 12/30/20 0224 12/30/20 1541 12/31/20 0431  NA 140 140 141 140 140  K 4.0 3.3* 3.1* 3.5 3.5  CL 103 102 101 102 98  CO2 27 28 30 30  33*  GLUCOSE 105* 101* 126* 127* 121*  BUN 20 15 13 13 12   CREATININE 1.15* 1.02* 1.00 1.02* 0.94  CALCIUM 9.1 8.3* 8.3* 8.2* 8.3*  MG  --   --  1.6*  --  1.8   GFR: Estimated Creatinine Clearance: 110.1 mL/min (by C-G formula based on SCr of 0.94 mg/dL). Liver Function Tests: Recent Labs  Lab 12/27/20 0908 12/28/20 2056 12/29/20 1904 12/30/20 0224 12/31/20 0431  AST 24 32  --  32 25  ALT 21 23  --  23 20  ALKPHOS 144* 111  --  108 94  BILITOT 1.3* 1.7* 1.5* 1.5* 1.1  PROT 6.9 6.8  --  6.9 6.5  ALBUMIN 3.8 3.3*  --  2.9* 2.8*   No results for input(s): LIPASE, AMYLASE in the last 168 hours. No results for input(s): AMMONIA in the last 168 hours. Coagulation Profile: Recent Labs  Lab 12/28/20 2057  INR 1.4*   Cardiac Enzymes: No results for input(s): CKTOTAL, CKMB, CKMBINDEX, TROPONINI in the last 168 hours. BNP (last 3 results) No results for input(s): PROBNP in the last 8760 hours. HbA1C: No results for input(s): HGBA1C in the last 72 hours. CBG: Recent Labs  Lab 12/30/20 1118 12/30/20 1544 12/30/20 2202 12/31/20 0617 12/31/20 1127  GLUCAP 135* 123* 116* 121* 108*   Lipid Profile: No results for input(s): CHOL, HDL, LDLCALC, TRIG, CHOLHDL, LDLDIRECT in the last 72 hours. Thyroid Function Tests: Recent Labs    12/30/20 0851  TSH 1.081   Anemia Panel: No results for input(s): VITAMINB12, FOLATE, FERRITIN, TIBC, IRON, RETICCTPCT in the last 72 hours. Urine analysis:    Component Value Date/Time   COLORURINE STRAW (A) 12/29/2020 0808   APPEARANCEUR CLEAR 12/29/2020 0808   LABSPEC 1.005 12/29/2020 0808    PHURINE 6.0 12/29/2020 0808   GLUCOSEU NEGATIVE 12/29/2020 0808   HGBUR NEGATIVE 12/29/2020 0808   BILIRUBINUR NEGATIVE 12/29/2020 0808   KETONESUR NEGATIVE 12/29/2020 0808   PROTEINUR NEGATIVE 12/29/2020 0808   NITRITE NEGATIVE 12/29/2020 0808   LEUKOCYTESUR NEGATIVE 12/29/2020 0808   Sepsis Labs: @LABRCNTIP (procalcitonin:4,lacticidven:4)  ) Recent Results (from the past 240 hour(s))  Resp Panel by RT-PCR (Flu A&B, Covid) Nasopharyngeal Swab     Status: None   Collection Time: 12/28/20  8:56 PM   Specimen: Nasopharyngeal Swab; Nasopharyngeal(NP) swabs in vial transport medium  Result Value Ref Range Status   SARS Coronavirus 2 by RT PCR NEGATIVE NEGATIVE Final    Comment: (NOTE) SARS-CoV-2 target nucleic acids are NOT DETECTED.  The SARS-CoV-2 RNA is generally detectable in upper respiratory specimens during  the acute phase of infection. The lowest concentration of SARS-CoV-2 viral copies this assay can detect is 138 copies/mL. A negative result does not preclude SARS-Cov-2 infection and should not be used as the sole basis for treatment or other patient management decisions. A negative result may occur with  improper specimen collection/handling, submission of specimen other than nasopharyngeal swab, presence of viral mutation(s) within the areas targeted by this assay, and inadequate number of viral copies(<138 copies/mL). A negative result must be combined with clinical observations, patient history, and epidemiological information. The expected result is Negative.  Fact Sheet for Patients:  BloggerCourse.com  Fact Sheet for Healthcare Providers:  SeriousBroker.it  This test is no t yet approved or cleared by the [place] FDA and  has been authorized for detection and/or diagnosis of SARS-CoV-2 by FDA under an Emergency Use Authorization (EUA). This EUA will remain  in effect (meaning this test can be used) for the  duration of the COVID-19 declaration under Section 564(b)(1) of the Act, 21 U.S.C.section 360bbb-3(b)(1), unless the authorization is terminated  or revoked sooner.       Influenza A by PCR NEGATIVE NEGATIVE Final   Influenza B by PCR NEGATIVE NEGATIVE Final    Comment: (NOTE) The Xpert Xpress SARS-CoV-2/FLU/RSV plus assay is intended as an aid in the diagnosis of influenza from Nasopharyngeal swab specimens and should not be used as a sole basis for treatment. Nasal washings and aspirates are unacceptable for Xpert Xpress SARS-CoV-2/FLU/RSV testing.  Fact Sheet for Patients: BloggerCourse.com  Fact Sheet for Healthcare Providers: SeriousBroker.it  This test is not yet approved or cleared by the [place] FDA and has been authorized for detection and/or diagnosis of SARS-CoV-2 by FDA under an Emergency Use Authorization (EUA). This EUA will remain in effect (meaning this test can be used) for the duration of the COVID-19 declaration under Section 564(b)(1) of the Act, 21 U.S.C. section 360bbb-3(b)(1), unless the authorization is terminated or revoked.  Performed at [facility], [address] [place], [place] [address]          Radiology Studies: DG CHEST PORT 1 VIEW  Result Date: 12/30/2020 CLINICAL DATA:  Preop for V/Q scan, evaluate PICC line placement EXAM: PORTABLE CHEST 1 VIEW COMPARISON:  12/28/2020 FINDINGS: Right-sided PICC line is seen with the catheter tip in the proximal superior vena cava. Increasing right basilar opacity is seen when compare with the previous day. Mild vascular congestion remains. IMPRESSION: Stable vascular congestion. PICC line in the proximal superior vena cava as described. Increasing right basilar opacity. Electronically Signed   By: Alcide Clever M.D.   On: 12/30/2020 15:45   [place] EKG SITE RITE  Result Date: 12/30/2020 If Site Rite image not attached, placement  could not be confirmed due to current cardiac rhythm.       Scheduled Meds: . Chlorhexidine Gluconate Cloth  6 each Topical Daily  . digoxin  0.125 mg Oral Daily  . furosemide  80 mg Intravenous TID AC  . insulin aspart  0-5 Units Subcutaneous QHS  . insulin aspart  0-9 Units Subcutaneous TID WC  . isosorbide-hydrALAZINE  1 tablet Oral TID  . potassium chloride  40 mEq Oral BID  . sacubitril-valsartan  1 tablet Oral BID  . sodium chloride flush  3 mL Intravenous Q12H  . spironolactone  25 mg Oral Daily   Continuous Infusions: . sodium chloride    . heparin 1,750 Units/hr (12/30/20 2346)     LOS: 3 days  Time spent: Pleas Koch, MD [facility] 1:22 PM   12/31/2020, 1:22 PM

## 2021-01-01 DIAGNOSIS — Z006 Encounter for examination for normal comparison and control in clinical research program: Secondary | ICD-10-CM

## 2021-01-01 DIAGNOSIS — E1165 Type 2 diabetes mellitus with hyperglycemia: Secondary | ICD-10-CM

## 2021-01-01 DIAGNOSIS — I5021 Acute systolic (congestive) heart failure: Secondary | ICD-10-CM | POA: Diagnosis not present

## 2021-01-01 LAB — COOXEMETRY PANEL
Carboxyhemoglobin: 1.4 % (ref 0.5–1.5)
Methemoglobin: 0.7 % (ref 0.0–1.5)
O2 Saturation: 69.7 %
Total hemoglobin: 12.9 g/dL (ref 12.0–16.0)

## 2021-01-01 LAB — COMPREHENSIVE METABOLIC PANEL
ALT: 19 U/L (ref 0–44)
AST: 21 U/L (ref 15–41)
Albumin: 2.6 g/dL — ABNORMAL LOW (ref 3.5–5.0)
Alkaline Phosphatase: 85 U/L (ref 38–126)
Anion gap: 8 (ref 5–15)
BUN: 11 mg/dL (ref 6–20)
CO2: 34 mmol/L — ABNORMAL HIGH (ref 22–32)
Calcium: 8.3 mg/dL — ABNORMAL LOW (ref 8.9–10.3)
Chloride: 99 mmol/L (ref 98–111)
Creatinine, Ser: 1.1 mg/dL — ABNORMAL HIGH (ref 0.44–1.00)
GFR, Estimated: >60 mL/min (ref >60)
Glucose, Bld: 105 mg/dL — ABNORMAL HIGH (ref 70–99)
Potassium: 4.2 mmol/L (ref 3.5–5.1)
Sodium: 141 mmol/L (ref 135–145)
Total Bilirubin: 1.1 mg/dL (ref 0.3–1.2)
Total Protein: 6.1 g/dL — ABNORMAL LOW (ref 6.5–8.1)

## 2021-01-01 LAB — CBC
HCT: 40.7 % (ref 36.0–46.0)
Hemoglobin: 12.6 g/dL (ref 12.0–15.0)
MCH: 26.2 pg (ref 26.0–34.0)
MCHC: 31 g/dL (ref 30.0–36.0)
MCV: 84.6 fL (ref 80.0–100.0)
Platelets: 313 10*3/uL (ref 150–400)
RBC: 4.81 MIL/uL (ref 3.87–5.11)
RDW: 18.6 % — ABNORMAL HIGH (ref 11.5–15.5)
WBC: 6.6 10*3/uL (ref 4.0–10.5)
nRBC: 0 % (ref 0.0–0.2)

## 2021-01-01 LAB — SCLERODERMA DIAGNOSTIC PROFILE
Anti Nuclear Antibody (ANA): NEGATIVE
Scleroderma (Scl-70) (ENA) Antibody, IgG: <0.2 AI (ref 0.0–0.9)

## 2021-01-01 LAB — RHEUMATOID FACTOR: Rheumatoid fact SerPl-aCnc: <10 IU/mL (ref <14.0)

## 2021-01-01 LAB — HEPARIN LEVEL (UNFRACTIONATED): Heparin Unfractionated: <0.1 IU/mL — ABNORMAL LOW (ref 0.30–0.70)

## 2021-01-01 LAB — GLUCOSE, CAPILLARY
Glucose-Capillary: 95 mg/dL (ref 70–99)
Glucose-Capillary: 113 mg/dL — ABNORMAL HIGH (ref 70–99)
Glucose-Capillary: 126 mg/dL — ABNORMAL HIGH (ref 70–99)
Glucose-Capillary: 169 mg/dL — ABNORMAL HIGH (ref 70–99)

## 2021-01-01 LAB — ANA W/REFLEX IF POSITIVE: Anti Nuclear Antibody (ANA): NEGATIVE

## 2021-01-01 MED ORDER — SODIUM CHLORIDE 0.9 % IV SOLN: 250.0000 mL | INTRAVENOUS | Status: DC | PRN

## 2021-01-01 MED ORDER — POTASSIUM CHLORIDE CRYS ER 20 MEQ PO TBCR
30.0000 meq | EXTENDED_RELEASE_TABLET | Freq: Two times a day (BID) | ORAL | Status: AC
  Administered 2021-01-01 (×2): 30 meq via ORAL
  Filled 2021-01-01 (×2): qty 1

## 2021-01-01 MED ORDER — SODIUM CHLORIDE 0.9% FLUSH
3.0000 mL | Freq: Two times a day (BID) | INTRAVENOUS | Status: DC
  Administered 2021-01-01 – 2021-01-02 (×3): 3 mL via INTRAVENOUS

## 2021-01-01 MED ORDER — CARVEDILOL 3.125 MG PO TABS
3.1250 mg | ORAL_TABLET | Freq: Two times a day (BID) | ORAL | Status: DC
  Administered 2021-01-01 – 2021-01-03 (×5): 3.125 mg via ORAL
  Filled 2021-01-01 (×5): qty 1

## 2021-01-01 MED ORDER — SODIUM CHLORIDE 0.9% FLUSH: 3.0000 mL | INTRAVENOUS | Status: DC | PRN

## 2021-01-01 MED ORDER — ASPIRIN 81 MG PO CHEW
81.0000 mg | CHEWABLE_TABLET | ORAL | Status: AC
  Administered 2021-01-02: 81 mg via ORAL
  Filled 2021-01-01: qty 1

## 2021-01-01 MED ORDER — ENOXAPARIN SODIUM 80 MG/0.8ML IJ SOSY
65.0000 mg | PREFILLED_SYRINGE | INTRAMUSCULAR | Status: DC
  Filled 2021-01-01: qty 0.8

## 2021-01-01 MED ORDER — IBUPROFEN 200 MG PO TABS
400.0000 mg | ORAL_TABLET | Freq: Three times a day (TID) | ORAL | Status: DC | PRN
  Administered 2021-01-01 – 2021-01-02 (×3): 400 mg via ORAL
  Filled 2021-01-01 (×4): qty 2

## 2021-01-01 MED ORDER — SODIUM CHLORIDE 0.9 % IV SOLN: INTRAVENOUS | Status: DC

## 2021-01-01 NOTE — Progress Notes (Addendum)
Advanced Heart Failure Rounding Note   Subjective:    V/Q scan negative for PE.   Brisk UOP yesterday w/ 8L out. Net negative 6L for the day. Wt down. SCr stable 1.10. K 4.2  BP remains mildly elevated.   Co-ox 70%. CVP 9-10   Feeling much better. No further orthopnea. Less exertional dyspnea. Only complaint is occasional HAs. Getting Bidil. Asking for PRN motrin. Acetaminophen doesn't help.   Objective:   Weight Range:  Vital Signs:   Temp:  [97.4 F (36.3 C)-98.4 F (36.9 C)] 97.6 F (36.4 C) (06/01 0810) Pulse Rate:  [93-102] 93 (06/01 0735) Resp:  [17-18] 18 (06/01 0735) BP: (121-142)/(68-96) 142/96 (06/01 0735) SpO2:  [95 %-98 %] 96 % (06/01 0735) Weight:  [136.6 kg] 136.6 kg (06/01 0413) Last BM Date: 12/31/20  Weight change: Filed Weights   12/30/20 0419 12/31/20 0421 01/01/21 0413  Weight: (!) (P) 144.7 kg (!) 146.8 kg (!) 136.6 kg    Intake/Output:   Intake/Output Summary (Last 24 hours) at 01/01/2021 0818 Last data filed at 01/01/2021 0710 Gross per 24 hour  Intake 1856.96 ml  Output 8400 ml  Net -6543.04 ml     PHYSICAL EXAM: CVP 9-10 General:  Well appearing. No respiratory difficulty HEENT: normal Neck: supple. Thick neck, JVD ~10 cm. Carotids 2+ bilat; no bruits. No lymphadenopathy or thyromegaly appreciated. Cor: PMI nondisplaced. Regular rate & rhythm. No rubs, gallops or murmurs. Lungs: clear Abdomen: soft, nontender, nondistended. No hepatosplenomegaly. No bruits or masses. Good bowel sounds. Extremities: no cyanosis, clubbing, rash, trace-1+ bilateral LE edema + bilateral Unna boots  Neuro: alert & oriented x 3, cranial nerves grossly intact. moves all 4 extremities w/o difficulty. Affect pleasant.   Telemetry: NSR 90s Personally reviewed   Labs: Basic Metabolic Panel: Recent Labs  Lab 12/29/20 1904 12/30/20 0224 12/30/20 1541 12/31/20 0431 01/01/21 0500  NA 140 141 140 140 141  K 3.3* 3.1* 3.5 3.5 4.2  CL 102 101 102 98 99   CO2 28 30 30  33* 34*  GLUCOSE 101* 126* 127* 121* 105*  BUN 15 13 13 12 11   CREATININE 1.02* 1.00 1.02* 0.94 1.10*  CALCIUM 8.3* 8.3* 8.2* 8.3* 8.3*  MG  --  1.6*  --  1.8  --     Liver Function Tests: Recent Labs  Lab 12/27/20 0908 12/28/20 2056 12/29/20 1904 12/30/20 0224 12/31/20 0431 01/01/21 0500  AST 24 32  --  32 25 21  ALT 21 23  --  23 20 19   ALKPHOS 144* 111  --  108 94 85  BILITOT 1.3* 1.7* 1.5* 1.5* 1.1 1.1  PROT 6.9 6.8  --  6.9 6.5 6.1*  ALBUMIN 3.8 3.3*  --  2.9* 2.8* 2.6*   No results for input(s): LIPASE, AMYLASE in the last 168 hours. No results for input(s): AMMONIA in the last 168 hours.  CBC: Recent Labs  Lab 12/27/20 0908 12/28/20 2056 12/29/20 0500 12/30/20 0224 12/31/20 0431 01/01/21 0500  WBC 5.7 7.1 6.6 5.8 6.3 6.6  NEUTROABS 3.7 4.6  --   --   --   --   HGB 14.3 14.9 13.7 13.8 13.0 12.6  HCT 45.1 47.0* 42.9 43.6 41.1 40.7  MCV 83 84.1 82.3 82.6 83.2 84.6  PLT 414 427* 367 382 331 313    Cardiac Enzymes: No results for input(s): CKTOTAL, CKMB, CKMBINDEX, TROPONINI in the last 168 hours.  BNP: BNP (last 3 results) Recent Labs    12/27/20 0908  12/28/20 2056  BNP 3,439.9* 2,539.8*    ProBNP (last 3 results) No results for input(s): PROBNP in the last 8760 hours.    Other results:  Imaging: NM Pulmonary Perfusion  Result Date: 12/31/2020 CLINICAL DATA:  PE suspected high probability.  45 year old female. EXAM: NUCLEAR MEDICINE PERFUSION LUNG SCAN TECHNIQUE: Perfusion images were obtained in multiple projections after intravenous injection of radiopharmaceutical. RADIOPHARMACEUTICALS:  4.1 mCi Tc-41m MAA COMPARISON:  Radiograph 12/30/2020 FINDINGS: There is decreased regional perfusion to the LEFT and RIGHT lung base which matches the lower lobe opacities on comparison radiograph. No wedge-shaped peripheral perfusion defects to suggest acute pulmonary embolism. IMPRESSION: No evidence acute pulmonary embolism. Decreased regional  perfusion of the lung bases matches opacities on comparison radiograph and favored atelectasis or infiltrates. Electronically Signed   By: Genevive Bi M.D.   On: 12/31/2020 13:21   DG CHEST PORT 1 VIEW  Result Date: 12/30/2020 CLINICAL DATA:  Preop for V/Q scan, evaluate PICC line placement EXAM: PORTABLE CHEST 1 VIEW COMPARISON:  12/28/2020 FINDINGS: Right-sided PICC line is seen with the catheter tip in the proximal superior vena cava. Increasing right basilar opacity is seen when compare with the previous day. Mild vascular congestion remains. IMPRESSION: Stable vascular congestion. PICC line in the proximal superior vena cava as described. Increasing right basilar opacity. Electronically Signed   By: Alcide Clever M.D.   On: 12/30/2020 15:45   VAS [place] LOWER EXTREMITY VENOUS (DVT)  Result Date: 12/31/2020  Lower Venous DVT Study Patient Name:  Jacqueline Ward  Date of Exam:   12/31/2020 Medical Rec #: [id number]       Accession #:    [id number] Date of Birth: 1975/12/21       Patient Gender: F Patient Age:   42Y Exam Location:  [facility] Procedure:      VAS [place] LOWER EXTREMITY VENOUS (DVT) Referring Phys: [id number] [facility] SAMTANI --------------------------------------------------------------------------------  Indications: Edema.  Limitations: Body habitus and poor ultrasound/tissue interface. Comparison Study: No previous exams Performing Technologist: Ernestene Mention  Examination Guidelines: A complete evaluation includes B-mode imaging, spectral Doppler, color Doppler, and power Doppler as needed of all accessible portions of each vessel. Bilateral testing is considered an integral part of a complete examination. Limited examinations for reoccurring indications may be performed as noted. The reflux portion of the exam is performed with the patient in reverse Trendelenburg.  +---------+---------------+---------+-----------+----------+--------------+ RIGHT     CompressibilityPhasicitySpontaneityPropertiesThrombus Aging +---------+---------------+---------+-----------+----------+--------------+ CFV      Full           Yes      Yes                                 +---------+---------------+---------+-----------+----------+--------------+ SFJ      Full                                                        +---------+---------------+---------+-----------+----------+--------------+ FV Prox  Full           Yes      Yes                                 +---------+---------------+---------+-----------+----------+--------------+ FV Mid   Full  Yes      Yes                                 +---------+---------------+---------+-----------+----------+--------------+ FV DistalFull           Yes      Yes                                 +---------+---------------+---------+-----------+----------+--------------+ PFV      Full                                                        +---------+---------------+---------+-----------+----------+--------------+ POP      Full           Yes      Yes                                 +---------+---------------+---------+-----------+----------+--------------+ PTV      Full                                                        +---------+---------------+---------+-----------+----------+--------------+ PERO     Full                                                        +---------+---------------+---------+-----------+----------+--------------+   +---------+---------------+---------+-----------+----------+--------------+ LEFT     CompressibilityPhasicitySpontaneityPropertiesThrombus Aging +---------+---------------+---------+-----------+----------+--------------+ CFV      Full           Yes      Yes                                 +---------+---------------+---------+-----------+----------+--------------+ SFJ      Full                                                         +---------+---------------+---------+-----------+----------+--------------+ FV Prox  Full           Yes      Yes                                 +---------+---------------+---------+-----------+----------+--------------+ FV Mid   Full           Yes      Yes                                 +---------+---------------+---------+-----------+----------+--------------+ FV DistalFull           Yes      Yes                                 +---------+---------------+---------+-----------+----------+--------------+  PFV      Full                                                        +---------+---------------+---------+-----------+----------+--------------+ POP      Full           Yes      Yes                                 +---------+---------------+---------+-----------+----------+--------------+ PTV      Full                                                        +---------+---------------+---------+-----------+----------+--------------+ PERO     Full                                                        +---------+---------------+---------+-----------+----------+--------------+     Summary: BILATERAL: - No evidence of deep vein thrombosis seen in the lower extremities, bilaterally. - No evidence of superficial venous thrombosis in the lower extremities, bilaterally. -No evidence of popliteal cyst, bilaterally.   *See table(s) above for measurements and observations. Electronically signed by Waverly Ferrari MD on 12/31/2020 at 4:14:46 PM.    Final    [place] EKG SITE RITE  Result Date: 12/30/2020 If Site Rite image not attached, placement could not be confirmed due to current cardiac rhythm.    Medications:     Scheduled Medications: . Chlorhexidine Gluconate Cloth  6 each Topical Daily  . digoxin  0.125 mg Oral Daily  . furosemide  80 mg Intravenous TID AC  . insulin aspart  0-5 Units Subcutaneous QHS  . insulin aspart  0-9 Units  Subcutaneous TID WC  . isosorbide-hydrALAZINE  1 tablet Oral TID  . potassium chloride  40 mEq Oral BID  . sacubitril-valsartan  1 tablet Oral BID  . sodium chloride flush  3 mL Intravenous Q12H  . spironolactone  25 mg Oral Daily    Infusions: . sodium chloride      PRN Medications: sodium chloride, acetaminophen, alum & mag hydroxide-simeth, hydrALAZINE, ondansetron (ZOFRAN) IV, sodium chloride flush, sodium chloride flush   Assessment/Plan:   1. Acute Biventricular Heart Failure - Echo: LVEF 20-25% w/ global HK, mild LVH, GIIDD. RV mildly enlarged w/ moderately reduced systolic function and severely elevated pulmonary artery pressure. RVSP 61.0 mmHg - Diuresing but still fluid overload. CVP 9-10. Continue IV Lasix 80 tid and supp K  - Continue Entresto to 97-103 bid - Continue Spiro 25 mg - Continue Digoxin 0.125 - Continue Bidil 1 tab tid. HAs may limit titration  - Add low dose Coreg 3.125 bid  - SGLT2i prior to d/c (Hgb A1c 7.4) - Suspect HTN CM. Can consider R/L cath prior to d/c  - Needs outpatient sleep study  - C/w UNNA boots  2. Pulmonary HTN - RVSP severely elevated on echo, 61 mmHG. RV mildly enlarged, systolic fx moderately reduced -  Needs RHC after diuresis - V/Q scan negative for PE - Serologies to r/o CTD pending  - Outpatient Sleep study to r/o OSA  - Outpatient PFTs. No tobacco history but ? Restriction given body habitus/ obesity   3. Type2DM - Hgb A1c 7.4  - insulin per primary  - SGLT2i soon   4. HTN - titrate GDMT per above - needs sleep study   5. Hypokalemia - K 4.2 - Supp w/ KCL - continue spiro and Entresto   6. Obesity Body mass index is 51.49 kg/m (pended).  - needs wt loss    Length of Stay: 4   Brittainy Simmons PA-C  01/01/2021, 8:18 AM  Advanced Heart Failure Team Pager [phone] (M-F; 7a - 4p)  Please contact [facility] for night-coverage after hours (4p -7a ) and weekends on [email or web address]  Patient seen and  examined with the above-signed Advanced Practice Provider and/or Housestaff. I personally reviewed laboratory data, imaging studies and relevant notes. I independently examined the patient and formulated the important aspects of the plan. I have edited the note to reflect any of my changes or salient points. I have personally discussed the plan with the patient and/or family.  Diuresing well. CVP down to 9-10. BP improved. Feeling better. No orthopnea or PND. Still tachycardic  General:  Sitting up in bed . No resp difficulty HEENT: normal Neck: supple.JVP 9-10 . Carotids 2+ bilat; no bruits. No lymphadenopathy or thryomegaly appreciated. Cor: PMI nondisplaced. Regular rtachy No rubs, gallops or murmurs. Lungs: clear Abdomen: obese soft, nontender, nondistended. No hepatosplenomegaly. No bruits or masses. Good bowel sounds. Extremities: no cyanosis, clubbing, rash, tr-1+ edema + UNNA Neuro: alert & orientedx3, cranial nerves grossly intact. moves all 4 extremities w/o difficulty. Affect pleasant  Volume status improved but still some volume on board. Continue IV diuresis. Adjust GDMT. Plan R/L cath tomorrow.   Arvilla Meresaniel Jerusalem Wert, MD  5:01 PM

## 2021-01-01 NOTE — Research (Signed)
IDENTIFY Informed Consent                  Subject Name: Jacqueline Ward   Subject met inclusion and exclusion criteria.  The informed consent form, study requirements and expectations were reviewed with the subject and questions and concerns were addressed prior to the signing of the consent form.  The subject verbalized understanding of the trial requirements.  The subject agreed to participate in the IDENTIFY trial and signed the informed consent at 15:27PM on 01/01/21.  The informed consent was obtained prior to performance of any protocol-specific procedures for the subject.  A copy of the signed informed consent was given to the subject and a copy was placed in the subject's medical record.   Meade Maw , Naval architect

## 2021-01-01 NOTE — Progress Notes (Signed)
Pt's weight this morning was 136.6 kg standing. Weight was checked twice.

## 2021-01-01 NOTE — Progress Notes (Signed)
[facility] PROGRESS NOTE    Progress Note  Jacqueline Ward  WUJ:[id number] DOB: Sep 06, 1975 DOA: 12/28/2020 PCP: Heather RobertsGray, Joseph M, NP     Brief Narrative:   Jacqueline Ward is an 45 y.o. female past medical history of diabetes mellitus type 2 obesity comes to the ED with worsening shortness of breath and increased abdominal distention, she also relates she has gained a lot of weight over the last several weeks admitted acute biventricular failure, PICC line placed heart failure team consulted.  2D echo was done that showed an EF of 20% with mildly enlarged right ventricular and reduced systolic function.   Significant studies: 12/31/2020 VQ scan negative for PE. 12/30/2020 chest x-ray shows pulmonary edema 12/28/2020 chest x-ray showed pulmonary edema  Antibiotics: None  Microbiology data: Blood culture:  Procedures: None  Assessment/Plan:   Acute congestive heart failure (HCC): 2D echo done that showed an EF of 20% with enlarged RV and mildly reduced systolic function.  Pulmonary pressures elevated. PICC line placed and she was started on IV diuresis. CVP is currently somewhere 9-10. Started on Entresto Aldactone digoxin, BiDil and Coreg. She was also started on Farxiga advanced heart failure team suspect this likely hypertensive cardiomyopathy. She will eventually need a right and left heart cath prior to DC.  Pulmonary hypertension: Started on IV diuresis VQ scan negative for PE. She will need a sleep study as an outpatient along with PFTs. Right heart cath prior to discharge to further evaluate.  Diabetes mellitus type 2: Started on Farxiga for heart failure, A1c of 7.4. Currently on sliding scale insulin blood glucose fairly controlled.  Essential hypertension: See heart failure for medications blood pressures fairly controlled on improving with titration of medications.  Hypokalemia: Repeated now on Entresto and Aldactone.  Elevated D-dimer on admission: VQ  scan negative.  Elevated troponins: Minimally elevated likely demand ischemia in the setting of heart failure and she denies any chest pain.  Mild hyperbilirubinemia: Resolved of unclear etiology LFTs normal's.    DVT prophylaxis: Lovenox Family Communication:none Status is: Inpatient  Remains inpatient appropriate because:Hemodynamically unstable   Dispo: The patient is from: Home              Anticipated d/c is to: Home              Patient currently is not medically stable to d/c.   Difficult to place patient No  Code Status:     Code Status Orders  (From admission, onward)         Start     Ordered   12/28/20 2247  Full code  Continuous        12/28/20 2250        Code Status History    This patient has a current code status but no historical code status.   Advance Care Planning Activity        IV Access:    Peripheral IV   Procedures and diagnostic studies:   NM Pulmonary Perfusion  Result Date: 12/31/2020 CLINICAL DATA:  PE suspected high probability.  45 year old female. EXAM: NUCLEAR MEDICINE PERFUSION LUNG SCAN TECHNIQUE: Perfusion images were obtained in multiple projections after intravenous injection of radiopharmaceutical. RADIOPHARMACEUTICALS:  4.1 mCi Tc-1953m MAA COMPARISON:  Radiograph 12/30/2020 FINDINGS: There is decreased regional perfusion to the LEFT and RIGHT lung base which matches the lower lobe opacities on comparison radiograph. No wedge-shaped peripheral perfusion defects to suggest acute pulmonary embolism. IMPRESSION: No evidence acute pulmonary embolism. Decreased regional perfusion of  the lung bases matches opacities on comparison radiograph and favored atelectasis or infiltrates. Electronically Signed   By: Genevive Bi M.D.   On: 12/31/2020 13:21   DG CHEST PORT 1 VIEW  Result Date: 12/30/2020 CLINICAL DATA:  Preop for V/Q scan, evaluate PICC line placement EXAM: PORTABLE CHEST 1 VIEW COMPARISON:  12/28/2020 FINDINGS:  Right-sided PICC line is seen with the catheter tip in the proximal superior vena cava. Increasing right basilar opacity is seen when compare with the previous day. Mild vascular congestion remains. IMPRESSION: Stable vascular congestion. PICC line in the proximal superior vena cava as described. Increasing right basilar opacity. Electronically Signed   By: Alcide Clever M.D.   On: 12/30/2020 15:45   VAS [place] LOWER EXTREMITY VENOUS (DVT)  Result Date: 12/31/2020  Lower Venous DVT Study Patient Name:  Jacqueline Ward  Date of Exam:   12/31/2020 Medical Rec #: [id number]       Accession #:    [id number] Date of Birth: August 18, 1947       Patient Gender: F Patient Age:   45Y Exam Location:  [facility] Procedure:      VAS [place] LOWER EXTREMITY VENOUS (DVT) Referring Phys: [id number] [facility] SAMTANI --------------------------------------------------------------------------------  Indications: Edema.  Limitations: Body habitus and poor ultrasound/tissue interface. Comparison Study: No previous exams Performing Technologist: Ernestene Mention  Examination Guidelines: A complete evaluation includes B-mode imaging, spectral Doppler, color Doppler, and power Doppler as needed of all accessible portions of each vessel. Bilateral testing is considered an integral part of a complete examination. Limited examinations for reoccurring indications may be performed as noted. The reflux portion of the exam is performed with the patient in reverse Trendelenburg.  +---------+---------------+---------+-----------+----------+--------------+ RIGHT    CompressibilityPhasicitySpontaneityPropertiesThrombus Aging +---------+---------------+---------+-----------+----------+--------------+ CFV      Full           Yes      Yes                                 +---------+---------------+---------+-----------+----------+--------------+ SFJ      Full                                                         +---------+---------------+---------+-----------+----------+--------------+ FV Prox  Full           Yes      Yes                                 +---------+---------------+---------+-----------+----------+--------------+ FV Mid   Full           Yes      Yes                                 +---------+---------------+---------+-----------+----------+--------------+ FV DistalFull           Yes      Yes                                 +---------+---------------+---------+-----------+----------+--------------+ PFV      Full                                                        +---------+---------------+---------+-----------+----------+--------------+  POP      Full           Yes      Yes                                 +---------+---------------+---------+-----------+----------+--------------+ PTV      Full                                                        +---------+---------------+---------+-----------+----------+--------------+ PERO     Full                                                        +---------+---------------+---------+-----------+----------+--------------+   +---------+---------------+---------+-----------+----------+--------------+ LEFT     CompressibilityPhasicitySpontaneityPropertiesThrombus Aging +---------+---------------+---------+-----------+----------+--------------+ CFV      Full           Yes      Yes                                 +---------+---------------+---------+-----------+----------+--------------+ SFJ      Full                                                        +---------+---------------+---------+-----------+----------+--------------+ FV Prox  Full           Yes      Yes                                 +---------+---------------+---------+-----------+----------+--------------+ FV Mid   Full           Yes      Yes                                  +---------+---------------+---------+-----------+----------+--------------+ FV DistalFull           Yes      Yes                                 +---------+---------------+---------+-----------+----------+--------------+ PFV      Full                                                        +---------+---------------+---------+-----------+----------+--------------+ POP      Full           Yes      Yes                                 +---------+---------------+---------+-----------+----------+--------------+ PTV  Full                                                        +---------+---------------+---------+-----------+----------+--------------+ PERO     Full                                                        +---------+---------------+---------+-----------+----------+--------------+     Summary: BILATERAL: - No evidence of deep vein thrombosis seen in the lower extremities, bilaterally. - No evidence of superficial venous thrombosis in the lower extremities, bilaterally. -No evidence of popliteal cyst, bilaterally.   *See table(s) above for measurements and observations. Electronically signed by Waverly Ferrari MD on 12/31/2020 at 4:14:46 PM.    Final    [place] EKG SITE RITE  Result Date: 12/30/2020 If Site Rite image not attached, placement could not be confirmed due to current cardiac rhythm.    Medical Consultants:    None.   Subjective:    NIESHIA LARMON she relates her breathing is better  Objective:    Vitals:   12/31/20 2330 01/01/21 0413 01/01/21 0735 01/01/21 0810  BP: 138/68 (!) 142/75 (!) 142/96   Pulse: (!) 101  93   Resp: 17 18 18    Temp: 98.2 F (36.8 C) 98.4 F (36.9 C)  97.6 F (36.4 C)  TempSrc: Oral Oral  Oral  SpO2: 96% 98% 96%   Weight:  (!) 136.6 kg    Height:       SpO2: 96 %   Intake/Output Summary (Last 24 hours) at 01/01/2021 1117 Last data filed at 01/01/2021 0900 Gross per 24 hour  Intake 1736.96 ml  Output  6750 ml  Net -5013.04 ml   Filed Weights   12/30/20 0419 12/31/20 0421 01/01/21 0413  Weight: (!) (P) 144.7 kg (!) 146.8 kg (!) 136.6 kg    Exam: General exam: In no acute distress. Respiratory system: Good air movement and clear to auscultation. Cardiovascular system: S1 & S2 heard, RRR. No JVD. Gastrointestinal system: Abdomen is nondistended, soft and nontender.  Extremities: No pedal edema. Skin: No rashes, lesions or ulcers Psychiatry: Judgement and insight appear normal. Mood & affect appropriate.    Data Reviewed:    Labs: Basic Metabolic Panel: Recent Labs  Lab 12/29/20 1904 12/30/20 0224 12/30/20 1541 12/31/20 0431 01/01/21 0500  NA 140 141 140 140 141  K 3.3* 3.1* 3.5 3.5 4.2  CL 102 101 102 98 99  CO2 28 30 30  33* 34*  GLUCOSE 101* 126* 127* 121* 105*  BUN 15 13 13 12 11   CREATININE 1.02* 1.00 1.02* 0.94 1.10*  CALCIUM 8.3* 8.3* 8.2* 8.3* 8.3*  MG  --  1.6*  --  1.8  --    GFR Estimated Creatinine Clearance: 90 mL/min (A) (by C-G formula based on SCr of 1.1 mg/dL (H)). Liver Function Tests: Recent Labs  Lab 12/27/20 0908 12/28/20 2056 12/29/20 1904 12/30/20 0224 12/31/20 0431 01/01/21 0500  AST 24 32  --  32 25 21  ALT 21 23  --  23 20 19   ALKPHOS 144* 111  --  108 94 85  BILITOT 1.3* 1.7* 1.5* 1.5* 1.1 1.1  PROT 6.9 6.8  --  6.9 6.5 6.1*  ALBUMIN 3.8 3.3*  --  2.9* 2.8* 2.6*   No results for input(s): LIPASE, AMYLASE in the last 168 hours. No results for input(s): AMMONIA in the last 168 hours. Coagulation profile Recent Labs  Lab 12/28/20 2057  INR 1.4*   COVID-19 Labs  No results for input(s): DDIMER, FERRITIN, LDH, CRP in the last 72 hours.  Lab Results  Component Value Date   SARSCOV2NAA NEGATIVE 12/28/2020    CBC: Recent Labs  Lab 12/27/20 0908 12/28/20 2056 12/29/20 0500 12/30/20 0224 12/31/20 0431 01/01/21 0500  WBC 5.7 7.1 6.6 5.8 6.3 6.6  NEUTROABS 3.7 4.6  --   --   --   --   HGB 14.3 14.9 13.7 13.8 13.0 12.6   HCT 45.1 47.0* 42.9 43.6 41.1 40.7  MCV 83 84.1 82.3 82.6 83.2 84.6  PLT 414 427* 367 382 331 313   Cardiac Enzymes: No results for input(s): CKTOTAL, CKMB, CKMBINDEX, TROPONINI in the last 168 hours. BNP (last 3 results) No results for input(s): PROBNP in the last 8760 hours. CBG: Recent Labs  Lab 12/31/20 1127 12/31/20 1618 12/31/20 2110 01/01/21 0559 01/01/21 1108  GLUCAP 108* 129* 124* 95 113*   D-Dimer: No results for input(s): DDIMER in the last 72 hours. Hgb A1c: No results for input(s): HGBA1C in the last 72 hours. Lipid Profile: No results for input(s): CHOL, HDL, LDLCALC, TRIG, CHOLHDL, LDLDIRECT in the last 72 hours. Thyroid function studies: Recent Labs    12/30/20 0851  TSH 1.081   Anemia work up: No results for input(s): VITAMINB12, FOLATE, FERRITIN, TIBC, IRON, RETICCTPCT in the last 72 hours. Sepsis Labs: Recent Labs  Lab 12/29/20 0500 12/30/20 0224 12/31/20 0431 01/01/21 0500  WBC 6.6 5.8 6.3 6.6   Microbiology Recent Results (from the past 240 hour(s))  Resp Panel by RT-PCR (Flu A&B, Covid) Nasopharyngeal Swab     Status: None   Collection Time: 12/28/20  8:56 PM   Specimen: Nasopharyngeal Swab; Nasopharyngeal(NP) swabs in vial transport medium  Result Value Ref Range Status   SARS Coronavirus 2 by RT PCR NEGATIVE NEGATIVE Final    Comment: (NOTE) SARS-CoV-2 target nucleic acids are NOT DETECTED.  The SARS-CoV-2 RNA is generally detectable in upper respiratory specimens during the acute phase of infection. The lowest concentration of SARS-CoV-2 viral copies this assay can detect is 138 copies/mL. A negative result does not preclude SARS-Cov-2 infection and should not be used as the sole basis for treatment or other patient management decisions. A negative result may occur with  improper specimen collection/handling, submission of specimen other than nasopharyngeal swab, presence of viral mutation(s) within the areas targeted by this  assay, and inadequate number of viral copies(<138 copies/mL). A negative result must be combined with clinical observations, patient history, and epidemiological information. The expected result is Negative.  Fact Sheet for Patients:  BloggerCourse.com  Fact Sheet for Healthcare Providers:  SeriousBroker.it  This test is no t yet approved or cleared by the [place] FDA and  has been authorized for detection and/or diagnosis of SARS-CoV-2 by FDA under an Emergency Use Authorization (EUA). This EUA will remain  in effect (meaning this test can be used) for the duration of the COVID-19 declaration under Section 564(b)(1) of the Act, 21 U.S.C.section 360bbb-3(b)(1), unless the authorization is terminated  or revoked sooner.       Influenza A by PCR NEGATIVE NEGATIVE Final   Influenza B by PCR NEGATIVE NEGATIVE  Final    Comment: (NOTE) The Xpert Xpress SARS-CoV-2/FLU/RSV plus assay is intended as an aid in the diagnosis of influenza from Nasopharyngeal swab specimens and should not be used as a sole basis for treatment. Nasal washings and aspirates are unacceptable for Xpert Xpress SARS-CoV-2/FLU/RSV testing.  Fact Sheet for Patients: BloggerCourse.com  Fact Sheet for Healthcare Providers: SeriousBroker.it  This test is not yet approved or cleared by the [place] FDA and has been authorized for detection and/or diagnosis of SARS-CoV-2 by FDA under an Emergency Use Authorization (EUA). This EUA will remain in effect (meaning this test can be used) for the duration of the COVID-19 declaration under Section 564(b)(1) of the Act, 21 U.S.C. section 360bbb-3(b)(1), unless the authorization is terminated or revoked.  Performed at [facility], [address] [place], [place] [address]      Medications:   . carvedilol  3.125 mg Oral BID WC  .  Chlorhexidine Gluconate Cloth  6 each Topical Daily  . digoxin  0.125 mg Oral Daily  . enoxaparin (LOVENOX) injection  65 mg Subcutaneous Q24H  . furosemide  80 mg Intravenous TID AC  . insulin aspart  0-5 Units Subcutaneous QHS  . insulin aspart  0-9 Units Subcutaneous TID WC  . isosorbide-hydrALAZINE  1 tablet Oral TID  . potassium chloride  30 mEq Oral BID  . sacubitril-valsartan  1 tablet Oral BID  . sodium chloride flush  3 mL Intravenous Q12H  . spironolactone  25 mg Oral Daily   Continuous Infusions: . sodium chloride        LOS: 4 days   Marinda Elk  [facility]  01/01/2021, 11:17 AM

## 2021-01-02 ENCOUNTER — Encounter (HOSPITAL_COMMUNITY)
Admission: EM | Disposition: A | Discharge status: Home / Self Care | Payer: Self-pay | Source: Home / Self Care | Attending: Internal Medicine

## 2021-01-02 DIAGNOSIS — E876 Hypokalemia: Secondary | ICD-10-CM | POA: Diagnosis not present

## 2021-01-02 DIAGNOSIS — I272 Pulmonary hypertension, unspecified: Secondary | ICD-10-CM | POA: Diagnosis not present

## 2021-01-02 DIAGNOSIS — I5021 Acute systolic (congestive) heart failure: Secondary | ICD-10-CM | POA: Diagnosis not present

## 2021-01-02 HISTORY — PX: RIGHT/LEFT HEART CATH AND CORONARY ANGIOGRAPHY: CATH118266

## 2021-01-02 LAB — BASIC METABOLIC PANEL
Anion gap: 8 (ref 5–15)
BUN: 17 mg/dL (ref 6–20)
CO2: 34 mmol/L — ABNORMAL HIGH (ref 22–32)
Calcium: 8.5 mg/dL — ABNORMAL LOW (ref 8.9–10.3)
Chloride: 97 mmol/L — ABNORMAL LOW (ref 98–111)
Creatinine, Ser: 1.06 mg/dL — ABNORMAL HIGH (ref 0.44–1.00)
GFR, Estimated: >60 mL/min (ref >60)
Glucose, Bld: 115 mg/dL — ABNORMAL HIGH (ref 70–99)
Potassium: 4.5 mmol/L (ref 3.5–5.1)
Sodium: 139 mmol/L (ref 135–145)

## 2021-01-02 LAB — POCT I-STAT EG7
Acid-Base Excess: 5 mmol/L — ABNORMAL HIGH (ref 0.0–2.0)
Acid-Base Excess: 6 mmol/L — ABNORMAL HIGH (ref 0.0–2.0)
Acid-Base Excess: 2 mmol/L (ref 0.0–2.0)
Bicarbonate: 32.1 mmol/L — ABNORMAL HIGH (ref 20.0–28.0)
Bicarbonate: 33.2 mmol/L — ABNORMAL HIGH (ref 20.0–28.0)
Bicarbonate: 28.9 mmol/L — ABNORMAL HIGH (ref 20.0–28.0)
Calcium, Ion: 1.11 mmol/L — ABNORMAL LOW (ref 1.15–1.40)
Calcium, Ion: 1.18 mmol/L (ref 1.15–1.40)
Calcium, Ion: 0.87 mmol/L — CL (ref 1.15–1.40)
HCT: 43 % (ref 36.0–46.0)
HCT: 44 % (ref 36.0–46.0)
HCT: 39 % (ref 36.0–46.0)
Hemoglobin: 14.6 g/dL (ref 12.0–15.0)
Hemoglobin: 15 g/dL (ref 12.0–15.0)
Hemoglobin: 13.3 g/dL (ref 12.0–15.0)
O2 Saturation: 70 %
O2 Saturation: 71 %
O2 Saturation: 81 %
Potassium: 4 mmol/L (ref 3.5–5.1)
Potassium: 4.1 mmol/L (ref 3.5–5.1)
Potassium: 3.5 mmol/L (ref 3.5–5.1)
Sodium: 139 mmol/L (ref 135–145)
Sodium: 141 mmol/L (ref 135–145)
Sodium: 143 mmol/L (ref 135–145)
TCO2: 34 mmol/L — ABNORMAL HIGH (ref 22–32)
TCO2: 35 mmol/L — ABNORMAL HIGH (ref 22–32)
TCO2: 30 mmol/L (ref 22–32)
pCO2, Ven: 55.7 mmHg (ref 44.0–60.0)
pCO2, Ven: 55.7 mmHg (ref 44.0–60.0)
pCO2, Ven: 50.8 mmHg (ref 44.0–60.0)
pH, Ven: 7.369 (ref 7.250–7.430)
pH, Ven: 7.383 (ref 7.250–7.430)
pH, Ven: 7.362 (ref 7.250–7.430)
pO2, Ven: 39 mmHg (ref 32.0–45.0)
pO2, Ven: 39 mmHg (ref 32.0–45.0)
pO2, Ven: 48 mmHg — ABNORMAL HIGH (ref 32.0–45.0)

## 2021-01-02 LAB — CBC WITH DIFFERENTIAL/PLATELET
Abs Immature Granulocytes: 0.02 10*3/uL (ref 0.00–0.07)
Basophils Absolute: 0.1 10*3/uL (ref 0.0–0.1)
Basophils Relative: 1 %
Eosinophils Absolute: 0.3 10*3/uL (ref 0.0–0.5)
Eosinophils Relative: 4 %
HCT: 41.2 % (ref 36.0–46.0)
Hemoglobin: 12.9 g/dL (ref 12.0–15.0)
Immature Granulocytes: 0 %
Lymphocytes Relative: 22 %
Lymphs Abs: 1.4 10*3/uL (ref 0.7–4.0)
MCH: 26.5 pg (ref 26.0–34.0)
MCHC: 31.3 g/dL (ref 30.0–36.0)
MCV: 84.6 fL (ref 80.0–100.0)
Monocytes Absolute: 0.8 10*3/uL (ref 0.1–1.0)
Monocytes Relative: 12 %
Neutro Abs: 4 10*3/uL (ref 1.7–7.7)
Neutrophils Relative %: 61 %
Platelets: 304 10*3/uL (ref 150–400)
RBC: 4.87 MIL/uL (ref 3.87–5.11)
RDW: 18.4 % — ABNORMAL HIGH (ref 11.5–15.5)
WBC: 6.5 10*3/uL (ref 4.0–10.5)
nRBC: 0 % (ref 0.0–0.2)

## 2021-01-02 LAB — POCT I-STAT 7, (LYTES, BLD GAS, ICA,H+H)
Acid-Base Excess: 6 mmol/L — ABNORMAL HIGH (ref 0.0–2.0)
Bicarbonate: 31.7 mmol/L — ABNORMAL HIGH (ref 20.0–28.0)
Calcium, Ion: 1.09 mmol/L — ABNORMAL LOW (ref 1.15–1.40)
HCT: 43 % (ref 36.0–46.0)
Hemoglobin: 14.6 g/dL (ref 12.0–15.0)
O2 Saturation: 99 %
Potassium: 4.1 mmol/L (ref 3.5–5.1)
Sodium: 139 mmol/L (ref 135–145)
TCO2: 33 mmol/L — ABNORMAL HIGH (ref 22–32)
pCO2 arterial: 50.7 mmHg — ABNORMAL HIGH (ref 32.0–48.0)
pH, Arterial: 7.404 (ref 7.350–7.450)
pO2, Arterial: 162 mmHg — ABNORMAL HIGH (ref 83.0–108.0)

## 2021-01-02 LAB — GLUCOSE, CAPILLARY
Glucose-Capillary: 106 mg/dL — ABNORMAL HIGH (ref 70–99)
Glucose-Capillary: 102 mg/dL — ABNORMAL HIGH (ref 70–99)
Glucose-Capillary: 99 mg/dL (ref 70–99)
Glucose-Capillary: 163 mg/dL — ABNORMAL HIGH (ref 70–99)

## 2021-01-02 LAB — COOXEMETRY PANEL
Carboxyhemoglobin: 1.4 % (ref 0.5–1.5)
Methemoglobin: 0.7 % (ref 0.0–1.5)
O2 Saturation: 75.6 %
Total hemoglobin: 13.2 g/dL (ref 12.0–16.0)

## 2021-01-02 SURGERY — RIGHT/LEFT HEART CATH AND CORONARY ANGIOGRAPHY
Anesthesia: LOCAL

## 2021-01-02 MED ORDER — BACITRACIN ZINC 500 UNIT/GM EX OINT
TOPICAL_OINTMENT | Freq: Two times a day (BID) | CUTANEOUS | Status: DC
  Filled 2021-01-02: qty 28.4

## 2021-01-02 MED ORDER — LIDOCAINE HCL (PF) 1 % IJ SOLN
INTRAMUSCULAR | Status: DC | PRN
  Administered 2021-01-02 (×2): 2 mL via INTRADERMAL

## 2021-01-02 MED ORDER — HEPARIN (PORCINE) IN NACL 1000-0.9 UT/500ML-% IV SOLN
INTRAVENOUS | Status: DC | PRN
  Administered 2021-01-02 (×2): 500 mL

## 2021-01-02 MED ORDER — HEPARIN SODIUM (PORCINE) 1000 UNIT/ML IJ SOLN
INTRAMUSCULAR | Status: AC
  Filled 2021-01-02: qty 1

## 2021-01-02 MED ORDER — SODIUM CHLORIDE 0.9 % IV SOLN: INTRAVENOUS | Status: AC

## 2021-01-02 MED ORDER — VERAPAMIL HCL 2.5 MG/ML IV SOLN
INTRAVENOUS | Status: AC
  Filled 2021-01-02: qty 2

## 2021-01-02 MED ORDER — LABETALOL HCL 5 MG/ML IV SOLN: 10.0000 mg | INTRAVENOUS | Status: AC | PRN

## 2021-01-02 MED ORDER — SODIUM CHLORIDE 0.9 % IV SOLN
250.0000 mL | INTRAVENOUS | Status: DC | PRN
Start: 2021-01-02 — End: 2021-01-03

## 2021-01-02 MED ORDER — HEPARIN (PORCINE) IN NACL 1000-0.9 UT/500ML-% IV SOLN
INTRAVENOUS | Status: AC
  Filled 2021-01-02: qty 500

## 2021-01-02 MED ORDER — HEPARIN SODIUM (PORCINE) 1000 UNIT/ML IJ SOLN
INTRAMUSCULAR | Status: DC | PRN
Start: 2021-01-02 — End: 2021-01-02
  Administered 2021-01-02: 6000 [IU] via INTRAVENOUS

## 2021-01-02 MED ORDER — IOHEXOL 350 MG/ML SOLN
INTRAVENOUS | Status: DC | PRN
  Administered 2021-01-02: 35 mL

## 2021-01-02 MED ORDER — FUROSEMIDE 10 MG/ML IJ SOLN
40.0000 mg | Freq: Once | INTRAMUSCULAR | Status: AC
  Administered 2021-01-02: 40 mg via INTRAVENOUS
  Filled 2021-01-02: qty 4

## 2021-01-02 MED ORDER — SODIUM CHLORIDE 0.9% FLUSH: 3.0000 mL | INTRAVENOUS | Status: DC | PRN

## 2021-01-02 MED ORDER — HYDRALAZINE HCL 20 MG/ML IJ SOLN: 10.0000 mg | INTRAMUSCULAR | Status: AC | PRN

## 2021-01-02 MED ORDER — SODIUM CHLORIDE 0.9% FLUSH
3.0000 mL | Freq: Two times a day (BID) | INTRAVENOUS | Status: DC
  Administered 2021-01-02 – 2021-01-03 (×2): 3 mL via INTRAVENOUS

## 2021-01-02 MED ORDER — MIDAZOLAM HCL 2 MG/2ML IJ SOLN
INTRAMUSCULAR | Status: AC
  Filled 2021-01-02: qty 2

## 2021-01-02 MED ORDER — FENTANYL CITRATE (PF) 100 MCG/2ML IJ SOLN
INTRAMUSCULAR | Status: AC
  Filled 2021-01-02: qty 2

## 2021-01-02 MED ORDER — VERAPAMIL HCL 2.5 MG/ML IV SOLN
INTRAVENOUS | Status: DC | PRN
  Administered 2021-01-02: 10 mL via INTRA_ARTERIAL

## 2021-01-02 MED ORDER — HYDRALAZINE HCL 25 MG PO TABS
25.0000 mg | ORAL_TABLET | Freq: Three times a day (TID) | ORAL | Status: DC
  Administered 2021-01-02 – 2021-01-03 (×4): 25 mg via ORAL
  Filled 2021-01-02 (×4): qty 1

## 2021-01-02 MED ORDER — FENTANYL CITRATE (PF) 100 MCG/2ML IJ SOLN
INTRAMUSCULAR | Status: DC | PRN
  Administered 2021-01-02: 25 ug via INTRAVENOUS

## 2021-01-02 MED ORDER — LIDOCAINE HCL (PF) 1 % IJ SOLN
INTRAMUSCULAR | Status: AC
  Filled 2021-01-02: qty 30

## 2021-01-02 MED ORDER — FUROSEMIDE 10 MG/ML IJ SOLN: 80.0000 mg | Freq: Once | INTRAMUSCULAR | Status: DC

## 2021-01-02 MED ORDER — MIDAZOLAM HCL 2 MG/2ML IJ SOLN
INTRAMUSCULAR | Status: DC | PRN
  Administered 2021-01-02 (×2): 1 mg via INTRAVENOUS

## 2021-01-02 MED ORDER — ONDANSETRON HCL 4 MG/2ML IJ SOLN: 4.0000 mg | Freq: Four times a day (QID) | INTRAMUSCULAR | Status: DC | PRN

## 2021-01-02 MED ORDER — ENOXAPARIN SODIUM 80 MG/0.8ML IJ SOSY
65.0000 mg | PREFILLED_SYRINGE | INTRAMUSCULAR | Status: DC
  Filled 2021-01-02: qty 0.8

## 2021-01-02 MED ORDER — ACETAMINOPHEN 325 MG PO TABS: 650.0000 mg | ORAL_TABLET | ORAL | Status: DC | PRN

## 2021-01-02 MED ORDER — ENOXAPARIN SODIUM 40 MG/0.4ML IJ SOSY: 40.0000 mg | PREFILLED_SYRINGE | INTRAMUSCULAR | Status: DC

## 2021-01-02 SURGICAL SUPPLY — 10 items
CATH 5FR JL3.5 JR4 ANG PIG MP (CATHETERS) ×1 IMPLANT
CATH SWAN GANZ 7F STRAIGHT (CATHETERS) ×1 IMPLANT
DEVICE RAD COMP TR BAND LRG (VASCULAR PRODUCTS) ×1 IMPLANT
GLIDESHEATH SLEND SS 6F .021 (SHEATH) ×1 IMPLANT
GLIDESHEATH SLENDER 7FR .021G (SHEATH) ×1 IMPLANT
GUIDEWIRE INQWIRE 1.5J.035X260 (WIRE) IMPLANT
INQWIRE 1.5J .035X260CM (WIRE) ×2
MAT PREVALON FULL STRYKER (MISCELLANEOUS) ×1 IMPLANT
PACK CARDIAC CATHETERIZATION (CUSTOM PROCEDURE TRAY) ×2 IMPLANT
TRANSDUCER W/STOPCOCK (MISCELLANEOUS) ×2 IMPLANT

## 2021-01-02 NOTE — Progress Notes (Addendum)
Advanced Heart Failure Rounding Note   Subjective:    V/Q scan negative for PE.   Brisk UOP yesterday w/ additional 7L out. Wt down another 10 lb. CVP 8   SCr stable 1.1. K 4.5  BP improved but having HAs w/ Bidil.  Co-ox 76%.   Breathing improved.    Objective:   Weight Range:  Vital Signs:   Temp:  [97.7 F (36.5 C)-98.3 F (36.8 C)] 98.3 F (36.8 C) (06/02 0528) Pulse Rate:  [93-105] 97 (06/02 0833) Resp:  [16-19] 19 (06/02 0833) BP: (119-131)/(73-92) 131/92 (06/02 0833) SpO2:  [98 %-100 %] 98 % (06/02 0833) Weight:  [132.2 kg] 132.2 kg (06/02 0533) Last BM Date: 12/31/20  Weight change: Filed Weights   12/31/20 0421 01/01/21 0413 01/02/21 0533  Weight: (!) 146.8 kg (!) 136.6 kg 132.2 kg    Intake/Output:   Intake/Output Summary (Last 24 hours) at 01/02/2021 0924 Last data filed at 01/02/2021 0826 Gross per 24 hour  Intake 290 ml  Output 5325 ml  Net -5035 ml      Telemetry: NSR 90s Personally reviewed   Labs: Basic Metabolic Panel: Recent Labs  Lab 12/30/20 0224 12/30/20 1541 12/31/20 0431 01/01/21 0500 01/02/21 0500  NA 141 140 140 141 139  K 3.1* 3.5 3.5 4.2 4.5  CL 101 102 98 99 97*  CO2 30 30 33* 34* 34*  GLUCOSE 126* 127* 121* 105* 115*  BUN 13 13 12 11 17   CREATININE 1.00 1.02* 0.94 1.10* 1.06*  CALCIUM 8.3* 8.2* 8.3* 8.3* 8.5*  MG 1.6*  --  1.8  --   --     Liver Function Tests: Recent Labs  Lab 12/27/20 0908 12/28/20 2056 12/29/20 1904 12/30/20 0224 12/31/20 0431 01/01/21 0500  AST 24 32  --  32 25 21  ALT 21 23  --  23 20 19   ALKPHOS 144* 111  --  108 94 85  BILITOT 1.3* 1.7* 1.5* 1.5* 1.1 1.1  PROT 6.9 6.8  --  6.9 6.5 6.1*  ALBUMIN 3.8 3.3*  --  2.9* 2.8* 2.6*   No results for input(s): LIPASE, AMYLASE in the last 168 hours. No results for input(s): AMMONIA in the last 168 hours.  CBC: Recent Labs  Lab 12/27/20 0908 12/28/20 2056 12/28/20 2056 12/29/20 0500 12/30/20 0224 12/31/20 0431 01/01/21 0500  01/02/21 0500  WBC 5.7 7.1   < > 6.6 5.8 6.3 6.6 6.5  NEUTROABS 3.7 4.6  --   --   --   --   --  4.0  HGB 14.3 14.9   < > 13.7 13.8 13.0 12.6 12.9  HCT 45.1 47.0*   < > 42.9 43.6 41.1 40.7 41.2  MCV 83 84.1   < > 82.3 82.6 83.2 84.6 84.6  PLT 414 427*   < > 367 382 331 313 304   < > = values in this interval not displayed.    Cardiac Enzymes: No results for input(s): CKTOTAL, CKMB, CKMBINDEX, TROPONINI in the last 168 hours.  BNP: BNP (last 3 results) Recent Labs    12/27/20 0908 12/28/20 2056  BNP 3,439.9* 2,539.8*    ProBNP (last 3 results) No results for input(s): PROBNP in the last 8760 hours.    Other results:  Imaging: NM Pulmonary Perfusion  Result Date: 12/31/2020 CLINICAL DATA:  PE suspected high probability.  45 year old female. EXAM: NUCLEAR MEDICINE PERFUSION LUNG SCAN TECHNIQUE: Perfusion images were obtained in multiple projections after intravenous injection of radiopharmaceutical.  RADIOPHARMACEUTICALS:  4.1 mCi Tc-60m MAA COMPARISON:  Radiograph 12/30/2020 FINDINGS: There is decreased regional perfusion to the LEFT and RIGHT lung base which matches the lower lobe opacities on comparison radiograph. No wedge-shaped peripheral perfusion defects to suggest acute pulmonary embolism. IMPRESSION: No evidence acute pulmonary embolism. Decreased regional perfusion of the lung bases matches opacities on comparison radiograph and favored atelectasis or infiltrates. Electronically Signed   By: Genevive Bi M.D.   On: 12/31/2020 13:21   VAS [place] LOWER EXTREMITY VENOUS (DVT)  Result Date: 12/31/2020  Lower Venous DVT Study Patient Name:  [name]  Date of Exam:   12/31/2020 Medical Rec #: [id number]       Accession #:    [id number] Date of Birth: 11/13/1975       Patient Gender: F Patient Age:   45Y Exam Location:  [facility] Procedure:      VAS [place] LOWER EXTREMITY VENOUS (DVT) Referring Phys: [id number] [facility] [place] SAMTANI  --------------------------------------------------------------------------------  Indications: Edema.  Limitations: Body habitus and poor ultrasound/tissue interface. Comparison Study: No previous exams Performing Technologist: Ernestene Mention  Examination Guidelines: A complete evaluation includes B-mode imaging, spectral Doppler, color Doppler, and power Doppler as needed of all accessible portions of each vessel. Bilateral testing is considered an integral part of a complete examination. Limited examinations for reoccurring indications may be performed as noted. The reflux portion of the exam is performed with the patient in reverse Trendelenburg.  +---------+---------------+---------+-----------+----------+--------------+ RIGHT    CompressibilityPhasicitySpontaneityPropertiesThrombus Aging +---------+---------------+---------+-----------+----------+--------------+ CFV      Full           Yes      Yes                                 +---------+---------------+---------+-----------+----------+--------------+ SFJ      Full                                                        +---------+---------------+---------+-----------+----------+--------------+ FV Prox  Full           Yes      Yes                                 +---------+---------------+---------+-----------+----------+--------------+ FV Mid   Full           Yes      Yes                                 +---------+---------------+---------+-----------+----------+--------------+ FV DistalFull           Yes      Yes                                 +---------+---------------+---------+-----------+----------+--------------+ PFV      Full                                                        +---------+---------------+---------+-----------+----------+--------------+  POP      Full           Yes      Yes                                 +---------+---------------+---------+-----------+----------+--------------+  PTV      Full                                                        +---------+---------------+---------+-----------+----------+--------------+ PERO     Full                                                        +---------+---------------+---------+-----------+----------+--------------+   +---------+---------------+---------+-----------+----------+--------------+ LEFT     CompressibilityPhasicitySpontaneityPropertiesThrombus Aging +---------+---------------+---------+-----------+----------+--------------+ CFV      Full           Yes      Yes                                 +---------+---------------+---------+-----------+----------+--------------+ SFJ      Full                                                        +---------+---------------+---------+-----------+----------+--------------+ FV Prox  Full           Yes      Yes                                 +---------+---------------+---------+-----------+----------+--------------+ FV Mid   Full           Yes      Yes                                 +---------+---------------+---------+-----------+----------+--------------+ FV DistalFull           Yes      Yes                                 +---------+---------------+---------+-----------+----------+--------------+ PFV      Full                                                        +---------+---------------+---------+-----------+----------+--------------+ POP      Full           Yes      Yes                                 +---------+---------------+---------+-----------+----------+--------------+ PTV  Full                                                        +---------+---------------+---------+-----------+----------+--------------+ PERO     Full                                                        +---------+---------------+---------+-----------+----------+--------------+     Summary: BILATERAL: - No evidence of deep  vein thrombosis seen in the lower extremities, bilaterally. - No evidence of superficial venous thrombosis in the lower extremities, bilaterally. -No evidence of popliteal cyst, bilaterally.   *See table(s) above for measurements and observations. Electronically signed by Waverly Ferrarihristopher Dickson MD on 12/31/2020 at 4:14:46 PM.    Final      Medications:     Scheduled Medications: . carvedilol  3.125 mg Oral BID WC  . Chlorhexidine Gluconate Cloth  6 each Topical Daily  . digoxin  0.125 mg Oral Daily  . enoxaparin (LOVENOX) injection  65 mg Subcutaneous Q24H  . insulin aspart  0-5 Units Subcutaneous QHS  . insulin aspart  0-9 Units Subcutaneous TID WC  . isosorbide-hydrALAZINE  1 tablet Oral TID  . sacubitril-valsartan  1 tablet Oral BID  . sodium chloride flush  3 mL Intravenous Q12H  . sodium chloride flush  3 mL Intravenous Q12H  . spironolactone  25 mg Oral Daily    Infusions: . sodium chloride    . sodium chloride    . sodium chloride 10 mL/hr at 01/02/21 0636    PRN Medications: sodium chloride, sodium chloride, acetaminophen, alum & mag hydroxide-simeth, hydrALAZINE, ibuprofen, ondansetron (ZOFRAN) IV, sodium chloride flush, sodium chloride flush, sodium chloride flush   Assessment/Plan:   1. Acute Biventricular Heart Failure - Echo: LVEF 20-25% w/ global HK, mild LVH, GIIDD. RV mildly enlarged w/ moderately reduced systolic function and severely elevated pulmonary artery pressure. RVSP 61.0 mmHg - Good diuresis. CVP down to 8. Give another dose of IV Lasix today, transition to PO tomorrow  - R/LHC today  - Continue Entresto to 97-103 bid - Continue Spiro 25 mg - Continue Digoxin 0.125 - Unable to tolerate Bidil due to HAs. Will d/c. Continue hydralazine 25 mg tid  - Continue low dose Coreg 3.125 bid . Titrate if output ok on cath  - SGLT2i prior to d/c (Hgb A1c 7.4) - Suspect HTN CM.  - Needs outpatient sleep study  - C/w UNNA boots  2. Pulmonary HTN - RVSP severely  elevated on echo, 61 mmHG. RV mildly enlarged, systolic fx moderately reduced - Needs RHC after diuresis - V/Q scan negative for PE - CTD Serologies negative  - Outpatient Sleep study to r/o OSA  - Outpatient PFTs. No tobacco history but ? Restriction given body habitus/ obesity   3. Type2DM - Hgb A1c 7.4  - insulin per primary  - SGLT2i soon   4. HTN - titrate GDMT per above - needs sleep study   5. Hypokalemia - K 4.5 - continue spiro and Entresto   6. Obesity Body mass index is 51.49 kg/m (pended).  - needs wt loss    Length of Stay: 5   Brittainy GoogleSimmons PA-C  01/02/2021, 9:24 AM  Advanced Heart Failure Team Pager [phone] (M-F; 7a - 4p)  Please contact [facility] for night-coverage after hours (4p -7a ) and weekends on [email or web address]  Patient seen and examined with the above-signed Advanced Practice Provider and/or Housestaff. I personally reviewed laboratory data, imaging studies and relevant notes. I independently examined the patient and formulated the important aspects of the plan. I have edited the note to reflect any of my changes or salient points. I have personally discussed the plan with the patient and/or family.  Volume status improving rapidly. CVP down to 8-9. Co-ox 76%. Unable to tolerate Bidil due to severe HAs.   General:  Sitting up in bed No resp difficulty HEENT: normal Neck: supple. no JVP mildly elevated. Carotids 2+ bilat; no bruits. No lymphadenopathy or thryomegaly appreciated. Cor: PMI nondisplaced. Regular rate & rhythm. No rubs, gallops or murmurs. Lungs: clear Abdomen: obese soft, nontender, nondistended. No hepatosplenomegaly. No bruits or masses. Good bowel sounds. Extremities: no cyanosis, clubbing, rash, tr-1+ edema + UNNA Neuro: alert & orientedx3, cranial nerves grossly intact. moves all 4 extremities w/o difficulty. Affect pleasant  Diuresing well. Co-ox ok. CVP coming down. Renal function stable. For R/L cath today.    Arvilla Meres, MD  4:53 PM

## 2021-01-02 NOTE — Progress Notes (Signed)
[facility] PROGRESS NOTE    Progress Note  Jacqueline Ward  DJS:[id number] DOB: 05/04/1976 DOA: 12/28/2020 PCP: Heather Roberts, NP     Brief Narrative:   Jacqueline Ward is an 45 y.o. female past medical history of diabetes mellitus type 2 obesity comes to the ED with worsening shortness of breath and increased abdominal distention, she also relates she has gained a lot of weight over the last several weeks admitted acute biventricular failure, PICC line placed heart failure team consulted.  2D echo was done that showed an EF of 20% with mildly enlarged right ventricular and reduced systolic function.   Significant studies: 12/31/2020 VQ scan negative for PE. 12/30/2020 chest x-ray shows pulmonary edema 12/28/2020 chest x-ray showed pulmonary edema  Antibiotics: None  Microbiology data: Blood culture:  Procedures: None  Assessment/Plan:   Acute congestive heart failure (HCC): 2D echo done that showed an EF of 20% with enlarged RV and mildly reduced systolic function.  Pulmonary pressures elevated. PICC line placed, continue IV Lasix she is negative about 23 L. CVP is currently somewhere 10-12. Started on Entresto Aldactone digoxin, BiDil and Coreg.  Discontinue ibuprofen. For right and left heart cath prior to DC.  Pulmonary hypertension: Started on IV diuresis VQ scan negative for PE. She will need a sleep study as an outpatient along with PFTs. Right heart cath prior to discharge to further evaluate.  Diabetes mellitus type 2: Started on Farxiga for heart failure, A1c of 7.4. Currently on sliding scale insulin blood glucose fairly controlled.  Essential hypertension: See heart failure for medications blood pressures fairly controlled on improving with titration of medications.  Hypokalemia: Repeated now on Entresto and Aldactone.  Elevated D-dimer on admission: VQ scan negative.  Elevated troponins: Minimally elevated likely demand ischemia in the setting  of heart failure and she denies any chest pain.  Mild hyperbilirubinemia: Resolved of unclear etiology LFTs normal's.    DVT prophylaxis: Lovenox Family Communication:none Status is: Inpatient  Remains inpatient appropriate because:Hemodynamically unstable   Dispo: The patient is from: Home              Anticipated d/c is to: Home              Patient currently is not medically stable to d/c.   Difficult to place patient No  Code Status:     Code Status Orders  (From admission, onward)         Start     Ordered   12/28/20 2247  Full code  Continuous        12/28/20 2250        Code Status History    This patient has a current code status but no historical code status.   Advance Care Planning Activity        IV Access:    Peripheral IV   Procedures and diagnostic studies:   NM Pulmonary Perfusion  Result Date: 12/31/2020 CLINICAL DATA:  PE suspected high probability.  45 year old female. EXAM: NUCLEAR MEDICINE PERFUSION LUNG SCAN TECHNIQUE: Perfusion images were obtained in multiple projections after intravenous injection of radiopharmaceutical. RADIOPHARMACEUTICALS:  4.1 mCi Tc-31m MAA COMPARISON:  Radiograph 12/30/2020 FINDINGS: There is decreased regional perfusion to the LEFT and RIGHT lung base which matches the lower lobe opacities on comparison radiograph. No wedge-shaped peripheral perfusion defects to suggest acute pulmonary embolism. IMPRESSION: No evidence acute pulmonary embolism. Decreased regional perfusion of the lung bases matches opacities on comparison radiograph and favored atelectasis or infiltrates. Electronically  Signed   By: Genevive Bi M.D.   On: 12/31/2020 13:21   VAS [place] LOWER EXTREMITY VENOUS (DVT)  Result Date: 12/31/2020  Lower Venous DVT Study Patient Name:  Jacqueline Ward  Date of Exam:   12/31/2020 Medical Rec #: [id number]       Accession #:    [id number] Date of Birth: 1975-08-31       Patient Gender: F Patient Age:   53Y Exam  Location:  [facility] Procedure:      VAS [place] LOWER EXTREMITY VENOUS (DVT) Referring Phys: [id number] [facility] SAMTANI --------------------------------------------------------------------------------  Indications: Edema.  Limitations: Body habitus and poor ultrasound/tissue interface. Comparison Study: No previous exams Performing Technologist: Ernestene Mention  Examination Guidelines: A complete evaluation includes B-mode imaging, spectral Doppler, color Doppler, and power Doppler as needed of all accessible portions of each vessel. Bilateral testing is considered an integral part of a complete examination. Limited examinations for reoccurring indications may be performed as noted. The reflux portion of the exam is performed with the patient in reverse Trendelenburg.  +---------+---------------+---------+-----------+----------+--------------+ RIGHT    CompressibilityPhasicitySpontaneityPropertiesThrombus Aging +---------+---------------+---------+-----------+----------+--------------+ CFV      Full           Yes      Yes                                 +---------+---------------+---------+-----------+----------+--------------+ SFJ      Full                                                        +---------+---------------+---------+-----------+----------+--------------+ FV Prox  Full           Yes      Yes                                 +---------+---------------+---------+-----------+----------+--------------+ FV Mid   Full           Yes      Yes                                 +---------+---------------+---------+-----------+----------+--------------+ FV DistalFull           Yes      Yes                                 +---------+---------------+---------+-----------+----------+--------------+ PFV      Full                                                        +---------+---------------+---------+-----------+----------+--------------+ POP      Full           Yes       Yes                                 +---------+---------------+---------+-----------+----------+--------------+ PTV      Full                                                        +---------+---------------+---------+-----------+----------+--------------+  PERO     Full                                                        +---------+---------------+---------+-----------+----------+--------------+   +---------+---------------+---------+-----------+----------+--------------+ LEFT     CompressibilityPhasicitySpontaneityPropertiesThrombus Aging +---------+---------------+---------+-----------+----------+--------------+ CFV      Full           Yes      Yes                                 +---------+---------------+---------+-----------+----------+--------------+ SFJ      Full                                                        +---------+---------------+---------+-----------+----------+--------------+ FV Prox  Full           Yes      Yes                                 +---------+---------------+---------+-----------+----------+--------------+ FV Mid   Full           Yes      Yes                                 +---------+---------------+---------+-----------+----------+--------------+ FV DistalFull           Yes      Yes                                 +---------+---------------+---------+-----------+----------+--------------+ PFV      Full                                                        +---------+---------------+---------+-----------+----------+--------------+ POP      Full           Yes      Yes                                 +---------+---------------+---------+-----------+----------+--------------+ PTV      Full                                                        +---------+---------------+---------+-----------+----------+--------------+ PERO     Full                                                         +---------+---------------+---------+-----------+----------+--------------+  Summary: BILATERAL: - No evidence of deep vein thrombosis seen in the lower extremities, bilaterally. - No evidence of superficial venous thrombosis in the lower extremities, bilaterally. -No evidence of popliteal cyst, bilaterally.   *See table(s) above for measurements and observations. Electronically signed by Waverly Ferrari MD on 12/31/2020 at 4:14:46 PM.    Final      Medical Consultants:    None.   Subjective:    Jacqueline Ward relates her breathing is significantly better no orthopnea.  Objective:    Vitals:   01/02/21 0010 01/02/21 0528 01/02/21 0533 01/02/21 0833  BP: 121/77 119/73  (!) 131/92  Pulse: 95 93  97  Resp: 19 16  19   Temp: 98 F (36.7 C) 98.3 F (36.8 C)    TempSrc: Oral Oral    SpO2: 98% 100%  98%  Weight:   132.2 kg   Height:       SpO2: 98 % O2 Flow Rate (L/min): 1 L/min   Intake/Output Summary (Last 24 hours) at 01/02/2021 1018 Last data filed at 01/02/2021 0826 Gross per 24 hour  Intake 290 ml  Output 5325 ml  Net -5035 ml   Filed Weights   12/31/20 0421 01/01/21 0413 01/02/21 0533  Weight: (!) 146.8 kg (!) 136.6 kg 132.2 kg    Exam: General exam: In no acute distress. Respiratory system: Good air movement and clear to auscultation. Cardiovascular system: S1 & S2 heard, RRR. No JVD. Gastrointestinal system: Abdomen is nondistended, soft and nontender.  Extremities: No pedal edema. Skin: No rashes, lesions or ulcers Psychiatry: Judgement and insight appear normal. Mood & affect appropriate.   Data Reviewed:    Labs: Basic Metabolic Panel: Recent Labs  Lab 12/30/20 0224 12/30/20 1541 12/31/20 0431 01/01/21 0500 01/02/21 0500  NA 141 140 140 141 139  K 3.1* 3.5 3.5 4.2 4.5  CL 101 102 98 99 97*  CO2 30 30 33* 34* 34*  GLUCOSE 126* 127* 121* 105* 115*  BUN 13 13 12 11 17   CREATININE 1.00 1.02* 0.94 1.10* 1.06*  CALCIUM 8.3* 8.2* 8.3* 8.3*  8.5*  MG 1.6*  --  1.8  --   --    GFR Estimated Creatinine Clearance: 91.7 mL/min (A) (by C-G formula based on SCr of 1.06 mg/dL (H)). Liver Function Tests: Recent Labs  Lab 12/27/20 0908 12/28/20 2056 12/29/20 1904 12/30/20 0224 12/31/20 0431 01/01/21 0500  AST 24 32  --  32 25 21  ALT 21 23  --  23 20 19   ALKPHOS 144* 111  --  108 94 85  BILITOT 1.3* 1.7* 1.5* 1.5* 1.1 1.1  PROT 6.9 6.8  --  6.9 6.5 6.1*  ALBUMIN 3.8 3.3*  --  2.9* 2.8* 2.6*   No results for input(s): LIPASE, AMYLASE in the last 168 hours. No results for input(s): AMMONIA in the last 168 hours. Coagulation profile Recent Labs  Lab 12/28/20 2057  INR 1.4*   COVID-19 Labs  No results for input(s): DDIMER, FERRITIN, LDH, CRP in the last 72 hours.  Lab Results  Component Value Date   SARSCOV2NAA NEGATIVE 12/28/2020    CBC: Recent Labs  Lab 12/27/20 0908 12/28/20 2056 12/28/20 2056 12/29/20 0500 12/30/20 0224 12/31/20 0431 01/01/21 0500 01/02/21 0500  WBC 5.7 7.1   < > 6.6 5.8 6.3 6.6 6.5  NEUTROABS 3.7 4.6  --   --   --   --   --  4.0  HGB 14.3 14.9   < > 13.7  13.8 13.0 12.6 12.9  HCT 45.1 47.0*   < > 42.9 43.6 41.1 40.7 41.2  MCV 83 84.1   < > 82.3 82.6 83.2 84.6 84.6  PLT 414 427*   < > 367 382 331 313 304   < > = values in this interval not displayed.   Cardiac Enzymes: No results for input(s): CKTOTAL, CKMB, CKMBINDEX, TROPONINI in the last 168 hours. BNP (last 3 results) No results for input(s): PROBNP in the last 8760 hours. CBG: Recent Labs  Lab 01/01/21 0559 01/01/21 1108 01/01/21 1612 01/01/21 2138 01/02/21 0624  GLUCAP 95 113* 126* 169* 106*   D-Dimer: No results for input(s): DDIMER in the last 72 hours. Hgb A1c: No results for input(s): HGBA1C in the last 72 hours. Lipid Profile: No results for input(s): CHOL, HDL, LDLCALC, TRIG, CHOLHDL, LDLDIRECT in the last 72 hours. Thyroid function studies: No results for input(s): TSH, T4TOTAL, T3FREE, THYROIDAB in the  last 72 hours.  Invalid input(s): FREET3 Anemia work up: No results for input(s): VITAMINB12, FOLATE, FERRITIN, TIBC, IRON, RETICCTPCT in the last 72 hours. Sepsis Labs: Recent Labs  Lab 12/30/20 0224 12/31/20 0431 01/01/21 0500 01/02/21 0500  WBC 5.8 6.3 6.6 6.5   Microbiology Recent Results (from the past 240 hour(s))  Resp Panel by RT-PCR (Flu A&B, Covid) Nasopharyngeal Swab     Status: None   Collection Time: 12/28/20  8:56 PM   Specimen: Nasopharyngeal Swab; Nasopharyngeal(NP) swabs in vial transport medium  Result Value Ref Range Status   SARS Coronavirus 2 by RT PCR NEGATIVE NEGATIVE Final    Comment: (NOTE) SARS-CoV-2 target nucleic acids are NOT DETECTED.  The SARS-CoV-2 RNA is generally detectable in upper respiratory specimens during the acute phase of infection. The lowest concentration of SARS-CoV-2 viral copies this assay can detect is 138 copies/mL. A negative result does not preclude SARS-Cov-2 infection and should not be used as the sole basis for treatment or other patient management decisions. A negative result may occur with  improper specimen collection/handling, submission of specimen other than nasopharyngeal swab, presence of viral mutation(s) within the areas targeted by this assay, and inadequate number of viral copies(<138 copies/mL). A negative result must be combined with clinical observations, patient history, and epidemiological information. The expected result is Negative.  Fact Sheet for Patients:  BloggerCourse.comhttps://www.fda.gov/media/152166/download  Fact Sheet for Healthcare Providers:  SeriousBroker.ithttps://www.fda.gov/media/152162/download  This test is no t yet approved or cleared by the [place] FDA and  has been authorized for detection and/or diagnosis of SARS-CoV-2 by FDA under an Emergency Use Authorization (EUA). This EUA will remain  in effect (meaning this test can be used) for the duration of the COVID-19 declaration under Section 564(b)(1) of  the Act, 21 U.S.C.section 360bbb-3(b)(1), unless the authorization is terminated  or revoked sooner.       Influenza A by PCR NEGATIVE NEGATIVE Final   Influenza B by PCR NEGATIVE NEGATIVE Final    Comment: (NOTE) The Xpert Xpress SARS-CoV-2/FLU/RSV plus assay is intended as an aid in the diagnosis of influenza from Nasopharyngeal swab specimens and should not be used as a sole basis for treatment. Nasal washings and aspirates are unacceptable for Xpert Xpress SARS-CoV-2/FLU/RSV testing.  Fact Sheet for Patients: BloggerCourse.comhttps://www.fda.gov/media/152166/download  Fact Sheet for Healthcare Providers: SeriousBroker.ithttps://www.fda.gov/media/152162/download  This test is not yet approved or cleared by the [place] FDA and has been authorized for detection and/or diagnosis of SARS-CoV-2 by FDA under an Emergency Use Authorization (EUA). This EUA will remain in effect (meaning  this test can be used) for the duration of the COVID-19 declaration under Section 564(b)(1) of the Act, 21 U.S.C. section 360bbb-3(b)(1), unless the authorization is terminated or revoked.  Performed at [facility], [address] [place], [place] [address]      Medications:   . carvedilol  3.125 mg Oral BID WC  . Chlorhexidine Gluconate Cloth  6 each Topical Daily  . digoxin  0.125 mg Oral Daily  . enoxaparin (LOVENOX) injection  65 mg Subcutaneous Q24H  . hydrALAZINE  25 mg Oral Q8H  . insulin aspart  0-5 Units Subcutaneous QHS  . insulin aspart  0-9 Units Subcutaneous TID WC  . sacubitril-valsartan  1 tablet Oral BID  . sodium chloride flush  3 mL Intravenous Q12H  . sodium chloride flush  3 mL Intravenous Q12H  . spironolactone  25 mg Oral Daily   Continuous Infusions: . sodium chloride    . sodium chloride    . sodium chloride 10 mL/hr at 01/02/21 0636      LOS: 5 days   Marinda Elk  [facility]  01/02/2021, 10:18 AM

## 2021-01-02 NOTE — H&P (View-Only) (Signed)
Advanced Heart Failure Rounding Note   Subjective:    V/Q scan negative for PE.   Brisk UOP yesterday w/ additional 7L out. Wt down another 10 lb. CVP 8   SCr stable 1.1. K 4.5  BP improved but having HAs w/ Bidil.  Co-ox 76%.   Breathing improved.    Objective:   Weight Range:  Vital Signs:   Temp:  [97.7 F (36.5 C)-98.3 F (36.8 C)] 98.3 F (36.8 C) (06/02 0528) Pulse Rate:  [93-105] 97 (06/02 0833) Resp:  [16-19] 19 (06/02 0833) BP: (119-131)/(73-92) 131/92 (06/02 0833) SpO2:  [98 %-100 %] 98 % (06/02 0833) Weight:  [132.2 kg] 132.2 kg (06/02 0533) Last BM Date: 12/31/20  Weight change: Filed Weights   12/31/20 0421 01/01/21 0413 01/02/21 0533  Weight: (!) 146.8 kg (!) 136.6 kg 132.2 kg    Intake/Output:   Intake/Output Summary (Last 24 hours) at 01/02/2021 0924 Last data filed at 01/02/2021 0826 Gross per 24 hour  Intake 290 ml  Output 5325 ml  Net -5035 ml      Telemetry: NSR 90s Personally reviewed   Labs: Basic Metabolic Panel: Recent Labs  Lab 12/30/20 0224 12/30/20 1541 12/31/20 0431 01/01/21 0500 01/02/21 0500  NA 141 140 140 141 139  K 3.1* 3.5 3.5 4.2 4.5  CL 101 102 98 99 97*  CO2 30 30 33* 34* 34*  GLUCOSE 126* 127* 121* 105* 115*  BUN 13 13 12 11 17   CREATININE 1.00 1.02* 0.94 1.10* 1.06*  CALCIUM 8.3* 8.2* 8.3* 8.3* 8.5*  MG 1.6*  --  1.8  --   --     Liver Function Tests: Recent Labs  Lab 12/27/20 0908 12/28/20 2056 12/29/20 1904 12/30/20 0224 12/31/20 0431 01/01/21 0500  AST 24 32  --  32 25 21  ALT 21 23  --  23 20 19   ALKPHOS 144* 111  --  108 94 85  BILITOT 1.3* 1.7* 1.5* 1.5* 1.1 1.1  PROT 6.9 6.8  --  6.9 6.5 6.1*  ALBUMIN 3.8 3.3*  --  2.9* 2.8* 2.6*   No results for input(s): LIPASE, AMYLASE in the last 168 hours. No results for input(s): AMMONIA in the last 168 hours.  CBC: Recent Labs  Lab 12/27/20 0908 12/28/20 2056 12/28/20 2056 12/29/20 0500 12/30/20 0224 12/31/20 0431 01/01/21 0500  01/02/21 0500  WBC 5.7 7.1   < > 6.6 5.8 6.3 6.6 6.5  NEUTROABS 3.7 4.6  --   --   --   --   --  4.0  HGB 14.3 14.9   < > 13.7 13.8 13.0 12.6 12.9  HCT 45.1 47.0*   < > 42.9 43.6 41.1 40.7 41.2  MCV 83 84.1   < > 82.3 82.6 83.2 84.6 84.6  PLT 414 427*   < > 367 382 331 313 304   < > = values in this interval not displayed.    Cardiac Enzymes: No results for input(s): CKTOTAL, CKMB, CKMBINDEX, TROPONINI in the last 168 hours.  BNP: BNP (last 3 results) Recent Labs    12/27/20 0908 12/28/20 2056  BNP 3,439.9* 2,539.8*    ProBNP (last 3 results) No results for input(s): PROBNP in the last 8760 hours.    Other results:  Imaging: NM Pulmonary Perfusion  Result Date: 12/31/2020 CLINICAL DATA:  PE suspected high probability.  45 year old female. EXAM: NUCLEAR MEDICINE PERFUSION LUNG SCAN TECHNIQUE: Perfusion images were obtained in multiple projections after intravenous injection of radiopharmaceutical.  RADIOPHARMACEUTICALS:  4.1 mCi Tc-60m MAA COMPARISON:  Radiograph 12/30/2020 FINDINGS: There is decreased regional perfusion to the LEFT and RIGHT lung base which matches the lower lobe opacities on comparison radiograph. No wedge-shaped peripheral perfusion defects to suggest acute pulmonary embolism. IMPRESSION: No evidence acute pulmonary embolism. Decreased regional perfusion of the lung bases matches opacities on comparison radiograph and favored atelectasis or infiltrates. Electronically Signed   By: Genevive Bi M.D.   On: 12/31/2020 13:21   VAS [place] LOWER EXTREMITY VENOUS (DVT)  Result Date: 12/31/2020  Lower Venous DVT Study Patient Name:  Jacqueline Ward  Date of Exam:   12/31/2020 Medical Rec #: [id number]       Accession #:    [id number] Date of Birth: 11/13/1975       Patient Gender: F Patient Age:   45Y Exam Location:  [facility] Procedure:      VAS [place] LOWER EXTREMITY VENOUS (DVT) Referring Phys: [id number] [facility] [place] SAMTANI  --------------------------------------------------------------------------------  Indications: Edema.  Limitations: Body habitus and poor ultrasound/tissue interface. Comparison Study: No previous exams Performing Technologist: Ernestene Mention  Examination Guidelines: A complete evaluation includes B-mode imaging, spectral Doppler, color Doppler, and power Doppler as needed of all accessible portions of each vessel. Bilateral testing is considered an integral part of a complete examination. Limited examinations for reoccurring indications may be performed as noted. The reflux portion of the exam is performed with the patient in reverse Trendelenburg.  +---------+---------------+---------+-----------+----------+--------------+ RIGHT    CompressibilityPhasicitySpontaneityPropertiesThrombus Aging +---------+---------------+---------+-----------+----------+--------------+ CFV      Full           Yes      Yes                                 +---------+---------------+---------+-----------+----------+--------------+ SFJ      Full                                                        +---------+---------------+---------+-----------+----------+--------------+ FV Prox  Full           Yes      Yes                                 +---------+---------------+---------+-----------+----------+--------------+ FV Mid   Full           Yes      Yes                                 +---------+---------------+---------+-----------+----------+--------------+ FV DistalFull           Yes      Yes                                 +---------+---------------+---------+-----------+----------+--------------+ PFV      Full                                                        +---------+---------------+---------+-----------+----------+--------------+  POP      Full           Yes      Yes                                 +---------+---------------+---------+-----------+----------+--------------+  PTV      Full                                                        +---------+---------------+---------+-----------+----------+--------------+ PERO     Full                                                        +---------+---------------+---------+-----------+----------+--------------+   +---------+---------------+---------+-----------+----------+--------------+ LEFT     CompressibilityPhasicitySpontaneityPropertiesThrombus Aging +---------+---------------+---------+-----------+----------+--------------+ CFV      Full           Yes      Yes                                 +---------+---------------+---------+-----------+----------+--------------+ SFJ      Full                                                        +---------+---------------+---------+-----------+----------+--------------+ FV Prox  Full           Yes      Yes                                 +---------+---------------+---------+-----------+----------+--------------+ FV Mid   Full           Yes      Yes                                 +---------+---------------+---------+-----------+----------+--------------+ FV DistalFull           Yes      Yes                                 +---------+---------------+---------+-----------+----------+--------------+ PFV      Full                                                        +---------+---------------+---------+-----------+----------+--------------+ POP      Full           Yes      Yes                                 +---------+---------------+---------+-----------+----------+--------------+ PTV  Full                                                        +---------+---------------+---------+-----------+----------+--------------+ PERO     Full                                                        +---------+---------------+---------+-----------+----------+--------------+     Summary: BILATERAL: - No evidence of deep  vein thrombosis seen in the lower extremities, bilaterally. - No evidence of superficial venous thrombosis in the lower extremities, bilaterally. -No evidence of popliteal cyst, bilaterally.   *See table(s) above for measurements and observations. Electronically signed by Christopher Dickson MD on 12/31/2020 at 4:14:46 PM.    Final      Medications:     Scheduled Medications: . carvedilol  3.125 mg Oral BID WC  . Chlorhexidine Gluconate Cloth  6 each Topical Daily  . digoxin  0.125 mg Oral Daily  . enoxaparin (LOVENOX) injection  65 mg Subcutaneous Q24H  . insulin aspart  0-5 Units Subcutaneous QHS  . insulin aspart  0-9 Units Subcutaneous TID WC  . isosorbide-hydrALAZINE  1 tablet Oral TID  . sacubitril-valsartan  1 tablet Oral BID  . sodium chloride flush  3 mL Intravenous Q12H  . sodium chloride flush  3 mL Intravenous Q12H  . spironolactone  25 mg Oral Daily    Infusions: . sodium chloride    . sodium chloride    . sodium chloride 10 mL/hr at 01/02/21 0636    PRN Medications: sodium chloride, sodium chloride, acetaminophen, alum & mag hydroxide-simeth, hydrALAZINE, ibuprofen, ondansetron (ZOFRAN) IV, sodium chloride flush, sodium chloride flush, sodium chloride flush   Assessment/Plan:   1. Acute Biventricular Heart Failure - Echo: LVEF 20-25% w/ global HK, mild LVH, GIIDD. RV mildly enlarged w/ moderately reduced systolic function and severely elevated pulmonary artery pressure. RVSP 61.0 mmHg - Good diuresis. CVP down to 8. Give another dose of IV Lasix today, transition to PO tomorrow  - R/LHC today  - Continue Entresto to 97-103 bid - Continue Spiro 25 mg - Continue Digoxin 0.125 - Unable to tolerate Bidil due to HAs. Will d/c. Continue hydralazine 25 mg tid  - Continue low dose Coreg 3.125 bid . Titrate if output ok on cath  - SGLT2i prior to d/c (Hgb A1c 7.4) - Suspect HTN CM.  - Needs outpatient sleep study  - C/w UNNA boots  2. Pulmonary HTN - RVSP severely  elevated on echo, 61 mmHG. RV mildly enlarged, systolic fx moderately reduced - Needs RHC after diuresis - V/Q scan negative for PE - CTD Serologies negative  - Outpatient Sleep study to r/o OSA  - Outpatient PFTs. No tobacco history but ? Restriction given body habitus/ obesity   3. Type2DM - Hgb A1c 7.4  - insulin per primary  - SGLT2i soon   4. HTN - titrate GDMT per above - needs sleep study   5. Hypokalemia - K 4.5 - continue spiro and Entresto   6. Obesity Body mass index is 51.49 kg/m (pended).  - needs wt loss    Length of Stay: 5   Brittainy Simmons PA-C    01/02/2021, 9:24 AM  Advanced Heart Failure Team Pager [phone] (M-F; 7a - 4p)  Please contact [facility] for night-coverage after hours (4p -7a ) and weekends on [email or web address]  Patient seen and examined with the above-signed Advanced Practice Provider and/or Housestaff. I personally reviewed laboratory data, imaging studies and relevant notes. I independently examined the patient and formulated the important aspects of the plan. I have edited the note to reflect any of my changes or salient points. I have personally discussed the plan with the patient and/or family.  Volume status improving rapidly. CVP down to 8-9. Co-ox 76%. Unable to tolerate Bidil due to severe HAs.   General:  Sitting up in bed No resp difficulty HEENT: normal Neck: supple. no JVP mildly elevated. Carotids 2+ bilat; no bruits. No lymphadenopathy or thryomegaly appreciated. Cor: PMI nondisplaced. Regular rate & rhythm. No rubs, gallops or murmurs. Lungs: clear Abdomen: obese soft, nontender, nondistended. No hepatosplenomegaly. No bruits or masses. Good bowel sounds. Extremities: no cyanosis, clubbing, rash, tr-1+ edema + UNNA Neuro: alert & orientedx3, cranial nerves grossly intact. moves all 4 extremities w/o difficulty. Affect pleasant  Diuresing well. Co-ox ok. CVP coming down. Renal function stable. For R/L cath today.    Jacqueline Meres, MD  4:53 PM

## 2021-01-02 NOTE — Interval H&P Note (Signed)
History and Physical Interval Note:  01/02/2021 4:54 PM  Jacqueline Ward  has presented today for surgery, with the diagnosis of heart failure.  The various methods of treatment have been discussed with the patient and family. After consideration of risks, benefits and other options for treatment, the patient has consented to  Procedure(s): RIGHT/LEFT HEART CATH AND CORONARY ANGIOGRAPHY (N/A) and possible coronary angioplasty as a surgical intervention.  The patient's history has been reviewed, patient examined, no change in status, stable for surgery.  I have reviewed the patient's chart and labs.  Questions were answered to the patient's satisfaction.     [name]

## 2021-01-02 NOTE — Progress Notes (Signed)
Attempted PIV in the patient's left hand. Was successful but the patient was complaining of pain to the left hand. She stated lab had been using that hand for lab draws and her hand has been sore since Monday. PIV was removed, and RN was made aware of the situation.

## 2021-01-03 ENCOUNTER — Other Ambulatory Visit (HOSPITAL_COMMUNITY): Payer: Self-pay

## 2021-01-03 ENCOUNTER — Encounter (HOSPITAL_COMMUNITY): Payer: Self-pay | Admitting: Internal Medicine

## 2021-01-03 ENCOUNTER — Encounter: Payer: No Typology Code available for payment source | Admitting: Nurse Practitioner

## 2021-01-03 DIAGNOSIS — I5021 Acute systolic (congestive) heart failure: Secondary | ICD-10-CM | POA: Diagnosis not present

## 2021-01-03 DIAGNOSIS — I10 Essential (primary) hypertension: Secondary | ICD-10-CM

## 2021-01-03 LAB — BASIC METABOLIC PANEL
Anion gap: 7 (ref 5–15)
BUN: 16 mg/dL (ref 6–20)
CO2: 31 mmol/L (ref 22–32)
Calcium: 8 mg/dL — ABNORMAL LOW (ref 8.9–10.3)
Chloride: 99 mmol/L (ref 98–111)
Creatinine, Ser: 0.85 mg/dL (ref 0.44–1.00)
GFR, Estimated: >60 mL/min (ref >60)
Glucose, Bld: 116 mg/dL — ABNORMAL HIGH (ref 70–99)
Potassium: 4 mmol/L (ref 3.5–5.1)
Sodium: 137 mmol/L (ref 135–145)

## 2021-01-03 LAB — COOXEMETRY PANEL
Carboxyhemoglobin: 1.5 % (ref 0.5–1.5)
Methemoglobin: 0.9 % (ref 0.0–1.5)
O2 Saturation: 73.9 %
Total hemoglobin: 13.5 g/dL (ref 12.0–16.0)

## 2021-01-03 LAB — GLUCOSE, CAPILLARY
Glucose-Capillary: 116 mg/dL — ABNORMAL HIGH (ref 70–99)
Glucose-Capillary: 96 mg/dL (ref 70–99)

## 2021-01-03 MED ORDER — HYDRALAZINE HCL 25 MG PO TABS
25.0000 mg | ORAL_TABLET | Freq: Three times a day (TID) | ORAL | 3 refills | Status: AC
  Filled 2021-01-03: qty 90, 30d supply, fill #0
  Filled 2021-01-30 – 2021-01-31 (×3): qty 90, 30d supply, fill #1
  Filled 2021-02-27: qty 90, 30d supply, fill #2
  Filled 2021-02-28 – 2021-03-03 (×3): qty 90, 30d supply, fill #0
  Filled 2021-05-01 (×2): qty 90, 30d supply, fill #1

## 2021-01-03 MED ORDER — METFORMIN HCL 500 MG PO TABS
500.0000 mg | ORAL_TABLET | Freq: Two times a day (BID) | ORAL | 11 refills | Status: AC
  Filled 2021-01-03: qty 60, 30d supply, fill #0

## 2021-01-03 MED ORDER — DIGOXIN 125 MCG PO TABS
0.1250 mg | ORAL_TABLET | Freq: Every day | ORAL | 3 refills | Status: AC
  Filled 2021-01-03: qty 30, 30d supply, fill #0
  Filled 2021-01-30 – 2021-01-31 (×3): qty 30, 30d supply, fill #1
  Filled 2021-02-27: qty 30, 30d supply, fill #2
  Filled 2021-02-28 – 2021-03-03 (×2): qty 30, 30d supply, fill #0
  Filled 2021-05-01: qty 30, 30d supply, fill #1

## 2021-01-03 MED ORDER — SACUBITRIL-VALSARTAN 97-103 MG PO TABS
1.0000 | ORAL_TABLET | Freq: Two times a day (BID) | ORAL | 3 refills | Status: AC
  Filled 2021-01-03: qty 60, 30d supply, fill #0
  Filled 2021-01-30 – 2021-01-31 (×5): qty 60, 30d supply, fill #1
  Filled 2021-02-27: qty 60, 30d supply, fill #2
  Filled 2021-02-28: qty 60, 30d supply, fill #0
  Filled 2021-03-03 (×3): qty 120, 60d supply, fill #0

## 2021-01-03 MED ORDER — BACITRACIN ZINC 500 UNIT/GM EX OINT
TOPICAL_OINTMENT | Freq: Two times a day (BID) | CUTANEOUS | Status: DC
  Filled 2021-01-03: qty 28.4

## 2021-01-03 MED ORDER — CARVEDILOL 3.125 MG PO TABS
3.1250 mg | ORAL_TABLET | Freq: Two times a day (BID) | ORAL | 3 refills | Status: DC
  Filled 2021-01-03: qty 60, 30d supply, fill #0
  Filled 2021-01-30 – 2021-01-31 (×3): qty 60, 30d supply, fill #1
  Filled 2021-02-27: qty 60, 30d supply, fill #2
  Filled 2021-02-28 – 2021-03-03 (×2): qty 60, 30d supply, fill #0
  Filled 2021-05-01 (×2): qty 60, 30d supply, fill #1

## 2021-01-03 MED ORDER — TORSEMIDE 20 MG PO TABS
40.0000 mg | ORAL_TABLET | Freq: Every day | ORAL | 3 refills | Status: DC
  Filled 2021-01-03: qty 60, 30d supply, fill #0
  Filled 2021-01-30 – 2021-01-31 (×3): qty 60, 30d supply, fill #1
  Filled 2021-02-27: qty 60, 30d supply, fill #2
  Filled 2021-02-28 – 2021-03-03 (×2): qty 60, 30d supply, fill #0
  Filled 2021-05-01 (×2): qty 60, 30d supply, fill #1

## 2021-01-03 MED ORDER — SPIRONOLACTONE 25 MG PO TABS
25.0000 mg | ORAL_TABLET | Freq: Every day | ORAL | 3 refills | Status: AC
Start: 2021-01-04 — End: ?
  Filled 2021-01-03: qty 30, 30d supply, fill #0
  Filled 2021-01-30 – 2021-01-31 (×3): qty 30, 30d supply, fill #1
  Filled 2021-02-27: qty 30, 30d supply, fill #2
  Filled 2021-02-28 – 2021-03-03 (×2): qty 30, 30d supply, fill #0
  Filled 2021-05-01 (×2): qty 30, 30d supply, fill #1

## 2021-01-03 MED ORDER — POTASSIUM CHLORIDE CRYS ER 20 MEQ PO TBCR
40.0000 meq | EXTENDED_RELEASE_TABLET | Freq: Every day | ORAL | 3 refills | Status: DC
  Filled 2021-01-03: qty 30, 15d supply, fill #0

## 2021-01-03 MED ORDER — TORSEMIDE 20 MG PO TABS
40.0000 mg | ORAL_TABLET | Freq: Every day | ORAL | Status: DC
  Administered 2021-01-03: 40 mg via ORAL
  Filled 2021-01-03: qty 2

## 2021-01-03 NOTE — Discharge Summary (Signed)
Physician Discharge Summary  Jacqueline Ward:[id number] DOB: 27-Nov-1975 DOA: 12/28/2020  PCP: Heather Roberts, NP  Admit date: 12/28/2020 Discharge date: 01/03/2021  Admitted From: Home Disposition:  Home  Recommendations for Outpatient Follow-up:  1. Follow up with Cardiology in 1-2 weeks 2. Please obtain BMP/CBC in one week   Home Health:No Equipment/Devices:None  Discharge Condition:Stable CODE STATUS:Full Diet recommendation: Heart Healthy  Brief/Interim Summary: 45 y.o. female past medical history of diabetes mellitus type 2 obesity comes to the ED with worsening shortness of breath and increased abdominal distention, she also relates she has gained a lot of weight over the last several weeks admitted acute biventricular failure, PICC line placed heart failure team consulted.  2D echo was done that showed an EF of 20% with mildly enlarged right ventricular and reduced systolic function.  Significant studies: 12/31/2020 VQ scan negative for PE. 12/30/2020 chest x-ray shows pulmonary edema 12/28/2020 chest x-ray showed pulmonary edema 01/02/2021 cardiac cath showed normal coronaries elevated pulmonary pressures and low EF.  Discharge Diagnoses:  Principal Problem:   Acute congestive heart failure (HCC) Active Problems:   Type II diabetes mellitus, uncontrolled (HCC)   Elevated d-dimer   AKI (acute kidney injury) (HCC)   Elevated troponin   Uncontrolled hypertension   CHF (congestive heart failure) (HCC)   Pulmonary hypertension, unspecified (HCC)  Acute congestive systolic heart failure: Likely due to hypertensive cardiomyopathy, right heart cath on 01/20/2021 showed normal coronaries and improve hemodynamics. She was started on IV Lasix on admission and she diuresed well over 25 L. She was started on Entresto, Aldactone, digoxin, hydralazine, Coreg, torsemide, potassium and Farxiga and her blood pressure tolerated it.  She will follow-up with cardiology as an outpatient and  will be titrated.  Pulmonary hypertension: VQ scan was negative for chronic PEs. She will need a sleep study as an outpatient. Right ventricular pressures were elevated likely due to dysfunction from her left ventricular side.  Diabetes mellitus type 2: Blood glucose fairly controlled she was started on Farxiga for heart failure hemoglobin A1c was 7.4 she was a started on metformin low-dose, which she will continue as an outpatient.  Essential hypertension: Blood pressure is fairly controlled, see heart failure for medication detail.  Hypokalemia: Was repleted orally now on Entresto and Aldactone and has stabilized.  Elevated D-dimer on admission: VQ scan was negative.  Elevated troponins: Minimally elevated likely demand ischemia in the setting of heart failure denies any chest pain.  Discharge Instructions  Discharge Instructions    Diet - low sodium heart healthy   Complete by: As directed    Increase activity slowly   Complete by: As directed    No wound care   Complete by: As directed      Allergies as of 01/03/2021   No Known Allergies     Medication List    STOP taking these medications   furosemide 80 MG tablet Commonly known as: LASIX     TAKE these medications   albuterol 108 (90 Base) MCG/ACT inhaler Commonly known as: VENTOLIN HFA Inhale 1-2 puffs into the lungs every 6 (six) hours as needed for wheezing or shortness of breath.   carvedilol 3.125 MG tablet Commonly known as: COREG Take 1 tablet (3.125 mg total) by mouth 2 (two) times daily with a meal.   digoxin 0.125 MG tablet Commonly known as: LANOXIN Take 1 tablet (0.125 mg total) by mouth daily. Start taking on: January 04, 2021   hydrALAZINE 25 MG tablet Commonly known as:  APRESOLINE Take 1 tablet (25 mg total) by mouth every 8 (eight) hours.   sacubitril-valsartan 97-103 MG Commonly known as: ENTRESTO Take 1 tablet by mouth 2 (two) times daily.   spironolactone 25 MG tablet Commonly  known as: ALDACTONE Take 1 tablet (25 mg total) by mouth daily. Start taking on: January 04, 2021   Torsemide 40 MG Tabs Take 40 mg by mouth daily.       No Known Allergies  Consultations:  Advanced heart failure team.   Procedures/Studies: DG Chest 2 View  Result Date: 12/28/2020 CLINICAL DATA:  Short of breath for 5 days EXAM: CHEST - 2 VIEW COMPARISON:  None. FINDINGS: Frontal and lateral views of the chest demonstrate an enlarged cardiac silhouette. There is increased central vascular congestion, with patchy areas of bibasilar consolidation and small effusions, greatest on the right. No pneumothorax. No acute bony abnormalities. IMPRESSION: 1. Findings consistent with mild congestive heart failure. Electronically Signed   By: Sharlet SalinaMichael  Brown M.D.   On: 12/28/2020 20:00   NM Pulmonary Perfusion  Result Date: 12/31/2020 CLINICAL DATA:  PE suspected high probability.  45 year old female. EXAM: NUCLEAR MEDICINE PERFUSION LUNG SCAN TECHNIQUE: Perfusion images were obtained in multiple projections after intravenous injection of radiopharmaceutical. RADIOPHARMACEUTICALS:  4.1 mCi Tc-252m MAA COMPARISON:  Radiograph 12/30/2020 FINDINGS: There is decreased regional perfusion to the LEFT and RIGHT lung base which matches the lower lobe opacities on comparison radiograph. No wedge-shaped peripheral perfusion defects to suggest acute pulmonary embolism. IMPRESSION: No evidence acute pulmonary embolism. Decreased regional perfusion of the lung bases matches opacities on comparison radiograph and favored atelectasis or infiltrates. Electronically Signed   By: Genevive BiStewart  Edmunds M.D.   On: 12/31/2020 13:21   CARDIAC CATHETERIZATION  Result Date: 01/02/2021 Findings: Ao = 132/92 (111) LV = 137/17 RA = 10 RV = 49/15 PA = 53/15 (29) PCW = 12 Fick cardiac output/index = 6.2/2.6 PVR = 2.7 WU SVR = 1297 FA sat = 99% PA sat = 70%, 71% SVC sat = 81% Assessment: 1. Normal coronary arteries 2. Severe NICM 3. Mild PAH  4. Relatively well-compensated left-sided filling pressures with normal cardiac output Plan/Discussion: Medical therapy. Arvilla Meresaniel Bensimhon, MD 6:12 PM  DG CHEST PORT 1 VIEW  Result Date: 12/30/2020 CLINICAL DATA:  Preop for V/Q scan, evaluate PICC line placement EXAM: PORTABLE CHEST 1 VIEW COMPARISON:  12/28/2020 FINDINGS: Right-sided PICC line is seen with the catheter tip in the proximal superior vena cava. Increasing right basilar opacity is seen when compare with the previous day. Mild vascular congestion remains. IMPRESSION: Stable vascular congestion. PICC line in the proximal superior vena cava as described. Increasing right basilar opacity. Electronically Signed   By: Alcide CleverMark  Lukens M.D.   On: 12/30/2020 15:45   ECHOCARDIOGRAM COMPLETE  Result Date: 12/29/2020    ECHOCARDIOGRAM REPORT   Patient Name:   Jacqueline Ward Date of Exam: 12/29/2020 Medical Rec #:  [id number]      Height:       66.0 in Accession #:    [id number]     Weight:       340.0 lb Date of Birth:  1976-05-07      BSA:          2.506 m Patient Age:    45 years       BP:           164/137 mmHg Patient Gender: F              HR:  95 bpm. Exam Location:  Inpatient Procedure: 2D Echo, Cardiac Doppler and Color Doppler Indications:    CHF  History:        Patient has no prior history of Echocardiogram examinations.                 Signs/Symptoms:Shortness of Breath and LE edema; Risk                 Factors:Morbid obesity, Diabetes and Hypertension.  Sonographer:    Lavenia Atlas Referring Phys: [id number] VASUNDHRA RATHORE  Sonographer Comments: Image acquisition challenging due to patient body habitus. IMPRESSIONS  1. Left ventricular ejection fraction, by estimation, is 20 to 25%. The left ventricle has severely decreased function. The left ventricle demonstrates global hypokinesis. The left ventricular internal cavity size was mildly dilated. There is mild left ventricular hypertrophy. Left ventricular diastolic parameters are  consistent with Grade II diastolic dysfunction (pseudonormalization). Elevated left atrial pressure.  2. Right ventricular systolic function is moderately reduced. The right ventricular size is mildly enlarged. There is severely elevated pulmonary artery systolic pressure.  3. Left atrial size was mildly dilated.  4. Right atrial size was mildly dilated.  5. The mitral valve is normal in structure. Trivial mitral valve regurgitation. No evidence of mitral stenosis.  6. The aortic valve is tricuspid. Aortic valve regurgitation is trivial. No aortic stenosis is present.  7. The inferior vena cava is dilated in size with <50% respiratory variability, suggesting right atrial pressure of 15 mmHg. FINDINGS  Left Ventricle: Left ventricular ejection fraction, by estimation, is 20 to 25%. The left ventricle has severely decreased function. The left ventricle demonstrates global hypokinesis. The left ventricular internal cavity size was mildly dilated. There is mild left ventricular hypertrophy. Left ventricular diastolic parameters are consistent with Grade II diastolic dysfunction (pseudonormalization). Elevated left atrial pressure. Right Ventricle: The right ventricular size is mildly enlarged.Right ventricular systolic function is moderately reduced. There is severely elevated pulmonary artery systolic pressure. The tricuspid regurgitant velocity is 3.39 m/s, and with an assumed right atrial pressure of 15 mmHg, the estimated right ventricular systolic pressure is 61.0 mmHg. Left Atrium: Left atrial size was mildly dilated. Right Atrium: Right atrial size was mildly dilated. Pericardium: There is no evidence of pericardial effusion. Mitral Valve: The mitral valve is normal in structure. Trivial mitral valve regurgitation. No evidence of mitral valve stenosis. Tricuspid Valve: The tricuspid valve is normal in structure. Tricuspid valve regurgitation is mild . No evidence of tricuspid stenosis. Aortic Valve: The aortic  valve is tricuspid. Aortic valve regurgitation is trivial. No aortic stenosis is present. Pulmonic Valve: The pulmonic valve was normal in structure. Pulmonic valve regurgitation is trivial. No evidence of pulmonic stenosis. Aorta: The aortic root is normal in size and structure. Venous: The inferior vena cava is dilated in size with less than 50% respiratory variability, suggesting right atrial pressure of 15 mmHg. IAS/Shunts: No atrial level shunt detected by color flow Doppler. Additional Comments: There is a small pleural effusion in the left lateral region.  LEFT VENTRICLE PLAX 2D LVIDd:         5.50 cm  Diastology LVIDs:         5.20 cm  LV e' medial:    5.00 cm/s LV PW:         1.30 cm  LV E/e' medial:  22.0 LV IVS:        1.10 cm  LV e' lateral:   4.03 cm/s LVOT diam:  2.00 cm  LV E/e' lateral: 27.3 LV SV:         38 LV SV Index:   15 LVOT Area:     3.14 cm  RIGHT VENTRICLE RV Basal diam:  4.50 cm RV S prime:     5.11 cm/s TAPSE (M-mode): 1.3 cm LEFT ATRIUM           Index       RIGHT ATRIUM           Index LA diam:      4.00 cm 1.60 cm/m  RA Area:     24.90 cm LA Vol (A2C): 57.3 ml 22.87 ml/m RA Volume:   81.10 ml  32.37 ml/m LA Vol (A4C): 86.3 ml 34.44 ml/m  AORTIC VALVE LVOT Vmax:   76.90 cm/s LVOT Vmean:  46.500 cm/s LVOT VTI:    0.122 m  AORTA Ao Root diam: 2.90 cm MITRAL VALVE                TRICUSPID VALVE MV Area (PHT): 7.66 cm     TR Peak grad:   46.0 mmHg MV Decel Time: 99 msec      TR Vmax:        339.00 cm/s MV E velocity: 110.00 cm/s MV A velocity: 59.70 cm/s   SHUNTS MV E/A ratio:  1.84         Systemic VTI:  0.12 m                             Systemic Diam: 2.00 cm Olga Millers MD Electronically signed by Olga Millers MD Signature Date/Time: 12/29/2020/10:31:16 AM    Final    VAS [place] LOWER EXTREMITY VENOUS (DVT)  Result Date: 12/31/2020  Lower Venous DVT Study Patient Name:  Jacqueline Ward  Date of Exam:   12/31/2020 Medical Rec #: [id number]       Accession #:    [id number] Date  of Birth: 04-Aug-1975       Patient Gender: F Patient Age:   8Y Exam Location:  [facility] Procedure:      VAS [place] LOWER EXTREMITY VENOUS (DVT) Referring Phys: [id number] [facility] SAMTANI --------------------------------------------------------------------------------  Indications: Edema.  Limitations: Body habitus and poor ultrasound/tissue interface. Comparison Study: No previous exams Performing Technologist: Ernestene Mention  Examination Guidelines: A complete evaluation includes B-mode imaging, spectral Doppler, color Doppler, and power Doppler as needed of all accessible portions of each vessel. Bilateral testing is considered an integral part of a complete examination. Limited examinations for reoccurring indications may be performed as noted. The reflux portion of the exam is performed with the patient in reverse Trendelenburg.  +---------+---------------+---------+-----------+----------+--------------+ RIGHT    CompressibilityPhasicitySpontaneityPropertiesThrombus Aging +---------+---------------+---------+-----------+----------+--------------+ CFV      Full           Yes      Yes                                 +---------+---------------+---------+-----------+----------+--------------+ SFJ      Full                                                        +---------+---------------+---------+-----------+----------+--------------+ FV Prox  Full  Yes      Yes                                 +---------+---------------+---------+-----------+----------+--------------+ FV Mid   Full           Yes      Yes                                 +---------+---------------+---------+-----------+----------+--------------+ FV DistalFull           Yes      Yes                                 +---------+---------------+---------+-----------+----------+--------------+ PFV      Full                                                         +---------+---------------+---------+-----------+----------+--------------+ POP      Full           Yes      Yes                                 +---------+---------------+---------+-----------+----------+--------------+ PTV      Full                                                        +---------+---------------+---------+-----------+----------+--------------+ PERO     Full                                                        +---------+---------------+---------+-----------+----------+--------------+   +---------+---------------+---------+-----------+----------+--------------+ LEFT     CompressibilityPhasicitySpontaneityPropertiesThrombus Aging +---------+---------------+---------+-----------+----------+--------------+ CFV      Full           Yes      Yes                                 +---------+---------------+---------+-----------+----------+--------------+ SFJ      Full                                                        +---------+---------------+---------+-----------+----------+--------------+ FV Prox  Full           Yes      Yes                                 +---------+---------------+---------+-----------+----------+--------------+ FV Mid   Full           Yes      Yes                                 +---------+---------------+---------+-----------+----------+--------------+  FV DistalFull           Yes      Yes                                 +---------+---------------+---------+-----------+----------+--------------+ PFV      Full                                                        +---------+---------------+---------+-----------+----------+--------------+ POP      Full           Yes      Yes                                 +---------+---------------+---------+-----------+----------+--------------+ PTV      Full                                                         +---------+---------------+---------+-----------+----------+--------------+ PERO     Full                                                        +---------+---------------+---------+-----------+----------+--------------+     Summary: BILATERAL: - No evidence of deep vein thrombosis seen in the lower extremities, bilaterally. - No evidence of superficial venous thrombosis in the lower extremities, bilaterally. -No evidence of popliteal cyst, bilaterally.   *See table(s) above for measurements and observations. Electronically signed by Waverly Ferrari MD on 12/31/2020 at 4:14:46 PM.    Final    [place] EKG SITE RITE  Result Date: 12/30/2020 If Site Rite image not attached, placement could not be confirmed due to current cardiac rhythm.    Subjective: No complaints  Discharge Exam: Vitals:   01/03/21 0539 01/03/21 0630  BP: 135/85 133/85  Pulse:  89  Resp:  (!) 23  Temp:  98.1 F (36.7 C)  SpO2:  98%   Vitals:   01/03/21 0144 01/03/21 0539 01/03/21 0630 01/03/21 0634  BP: 135/90 135/85 133/85   Pulse: 94  89   Resp: 16  (!) 23   Temp: 98 F (36.7 C)  98.1 F (36.7 C)   TempSrc: Oral  Oral   SpO2: 99%  98%   Weight:    131.5 kg  Height:        General: Pt is alert, awake, not in acute distress Cardiovascular: RRR, S1/S2 +, no rubs, no gallops Respiratory: CTA bilaterally, no wheezing, no rhonchi Abdominal: Soft, NT, ND, bowel sounds + Extremities: no edema, no cyanosis    The results of significant diagnostics from this hospitalization (including imaging, microbiology, ancillary and laboratory) are listed below for reference.     Microbiology: Recent Results (from the past 240 hour(s))  Resp Panel by RT-PCR (Flu A&B, Covid) Nasopharyngeal Swab     Status: None   Collection Time: 12/28/20  8:56 PM   Specimen: Nasopharyngeal Swab; Nasopharyngeal(NP) swabs in  vial transport medium  Result Value Ref Range Status   SARS Coronavirus 2 by RT PCR NEGATIVE NEGATIVE Final     Comment: (NOTE) SARS-CoV-2 target nucleic acids are NOT DETECTED.  The SARS-CoV-2 RNA is generally detectable in upper respiratory specimens during the acute phase of infection. The lowest concentration of SARS-CoV-2 viral copies this assay can detect is 138 copies/mL. A negative result does not preclude SARS-Cov-2 infection and should not be used as the sole basis for treatment or other patient management decisions. A negative result may occur with  improper specimen collection/handling, submission of specimen other than nasopharyngeal swab, presence of viral mutation(s) within the areas targeted by this assay, and inadequate number of viral copies(<138 copies/mL). A negative result must be combined with clinical observations, patient history, and epidemiological information. The expected result is Negative.  Fact Sheet for Patients:  BloggerCourse.com  Fact Sheet for Healthcare Providers:  SeriousBroker.it  This test is no t yet approved or cleared by the [place] FDA and  has been authorized for detection and/or diagnosis of SARS-CoV-2 by FDA under an Emergency Use Authorization (EUA). This EUA will remain  in effect (meaning this test can be used) for the duration of the COVID-19 declaration under Section 564(b)(1) of the Act, 21 U.S.C.section 360bbb-3(b)(1), unless the authorization is terminated  or revoked sooner.       Influenza A by PCR NEGATIVE NEGATIVE Final   Influenza B by PCR NEGATIVE NEGATIVE Final    Comment: (NOTE) The Xpert Xpress SARS-CoV-2/FLU/RSV plus assay is intended as an aid in the diagnosis of influenza from Nasopharyngeal swab specimens and should not be used as a sole basis for treatment. Nasal washings and aspirates are unacceptable for Xpert Xpress SARS-CoV-2/FLU/RSV testing.  Fact Sheet for Patients: BloggerCourse.com  Fact Sheet for Healthcare  Providers: SeriousBroker.it  This test is not yet approved or cleared by the [place] FDA and has been authorized for detection and/or diagnosis of SARS-CoV-2 by FDA under an Emergency Use Authorization (EUA). This EUA will remain in effect (meaning this test can be used) for the duration of the COVID-19 declaration under Section 564(b)(1) of the Act, 21 U.S.C. section 360bbb-3(b)(1), unless the authorization is terminated or revoked.  Performed at [facility], [address], [place], [place] [address]      Labs: BNP (last 3 results) Recent Labs    12/27/20 0908 12/28/20 2056  BNP 3,439.9* 2,539.8*   Basic Metabolic Panel: Recent Labs  Lab 12/30/20 0224 12/30/20 1541 12/31/20 0431 01/01/21 0500 01/02/21 0500 01/02/21 1726 01/02/21 1729 01/02/21 1738 01/03/21 0350  NA 141 140 140 141 139 139  141 139 143 137  K 3.1* 3.5 3.5 4.2 4.5 4.1  4.0 4.1 3.5 4.0  CL 101 102 98 99 97*  --   --   --  99  CO2 30 30 33* 34* 34*  --   --   --  31  GLUCOSE 126* 127* 121* 105* 115*  --   --   --  116*  BUN 13 13 12 11 17   --   --   --  16  CREATININE 1.00 1.02* 0.94 1.10* 1.06*  --   --   --  0.85  CALCIUM 8.3* 8.2* 8.3* 8.3* 8.5*  --   --   --  8.0*  MG 1.6*  --  1.8  --   --   --   --   --   --    Liver  Function Tests: Recent Labs  Lab 12/28/20 2056 12/29/20 1904 12/30/20 0224 12/31/20 0431 01/01/21 0500  AST 32  --  32 25 21  ALT 23  --  23 20 19   ALKPHOS 111  --  108 94 85  BILITOT 1.7* 1.5* 1.5* 1.1 1.1  PROT 6.8  --  6.9 6.5 6.1*  ALBUMIN 3.3*  --  2.9* 2.8* 2.6*   No results for input(s): LIPASE, AMYLASE in the last 168 hours. No results for input(s): AMMONIA in the last 168 hours. CBC: Recent Labs  Lab 12/28/20 2056 12/29/20 0500 12/30/20 0224 12/31/20 0431 01/01/21 0500 01/02/21 0500 01/02/21 1726 01/02/21 1729 01/02/21 1738  WBC 7.1 6.6 5.8 6.3 6.6 6.5  --   --   --   NEUTROABS 4.6  --   --   --    --  4.0  --   --   --   HGB 14.9 13.7 13.8 13.0 12.6 12.9 14.6  15.0 14.6 13.3  HCT 47.0* 42.9 43.6 41.1 40.7 41.2 43.0  44.0 43.0 39.0  MCV 84.1 82.3 82.6 83.2 84.6 84.6  --   --   --   PLT 427* 367 382 331 313 304  --   --   --    Cardiac Enzymes: No results for input(s): CKTOTAL, CKMB, CKMBINDEX, TROPONINI in the last 168 hours. BNP: Invalid input(s): POCBNP CBG: Recent Labs  Lab 01/02/21 0624 01/02/21 1134 01/02/21 1840 01/02/21 2138 01/03/21 0628  GLUCAP 106* 102* 99 163* 116*   D-Dimer No results for input(s): DDIMER in the last 72 hours. Hgb A1c No results for input(s): HGBA1C in the last 72 hours. Lipid Profile No results for input(s): CHOL, HDL, LDLCALC, TRIG, CHOLHDL, LDLDIRECT in the last 72 hours. Thyroid function studies No results for input(s): TSH, T4TOTAL, T3FREE, THYROIDAB in the last 72 hours.  Invalid input(s): FREET3 Anemia work up No results for input(s): VITAMINB12, FOLATE, FERRITIN, TIBC, IRON, RETICCTPCT in the last 72 hours. Urinalysis    Component Value Date/Time   COLORURINE STRAW (A) 12/29/2020 0808   APPEARANCEUR CLEAR 12/29/2020 0808   LABSPEC 1.005 12/29/2020 0808   PHURINE 6.0 12/29/2020 0808   GLUCOSEU NEGATIVE 12/29/2020 0808   HGBUR NEGATIVE 12/29/2020 0808   BILIRUBINUR NEGATIVE 12/29/2020 0808   KETONESUR NEGATIVE 12/29/2020 0808   PROTEINUR NEGATIVE 12/29/2020 0808   NITRITE NEGATIVE 12/29/2020 0808   LEUKOCYTESUR NEGATIVE 12/29/2020 0808   Sepsis Labs Invalid input(s): PROCALCITONIN,  WBC,  LACTICIDVEN Microbiology Recent Results (from the past 240 hour(s))  Resp Panel by RT-PCR (Flu A&B, Covid) Nasopharyngeal Swab     Status: None   Collection Time: 12/28/20  8:56 PM   Specimen: Nasopharyngeal Swab; Nasopharyngeal(NP) swabs in vial transport medium  Result Value Ref Range Status   SARS Coronavirus 2 by RT PCR NEGATIVE NEGATIVE Final    Comment: (NOTE) SARS-CoV-2 target nucleic acids are NOT DETECTED.  The SARS-CoV-2  RNA is generally detectable in upper respiratory specimens during the acute phase of infection. The lowest concentration of SARS-CoV-2 viral copies this assay can detect is 138 copies/mL. A negative result does not preclude SARS-Cov-2 infection and should not be used as the sole basis for treatment or other patient management decisions. A negative result may occur with  improper specimen collection/handling, submission of specimen other than nasopharyngeal swab, presence of viral mutation(s) within the areas targeted by this assay, and inadequate number of viral copies(<138 copies/mL). A negative result must be combined with clinical observations, patient history, and  epidemiological information. The expected result is Negative.  Fact Sheet for Patients:  BloggerCourse.com  Fact Sheet for Healthcare Providers:  SeriousBroker.it  This test is no t yet approved or cleared by the [place] FDA and  has been authorized for detection and/or diagnosis of SARS-CoV-2 by FDA under an Emergency Use Authorization (EUA). This EUA will remain  in effect (meaning this test can be used) for the duration of the COVID-19 declaration under Section 564(b)(1) of the Act, 21 U.S.C.section 360bbb-3(b)(1), unless the authorization is terminated  or revoked sooner.       Influenza A by PCR NEGATIVE NEGATIVE Final   Influenza B by PCR NEGATIVE NEGATIVE Final    Comment: (NOTE) The Xpert Xpress SARS-CoV-2/FLU/RSV plus assay is intended as an aid in the diagnosis of influenza from Nasopharyngeal swab specimens and should not be used as a sole basis for treatment. Nasal washings and aspirates are unacceptable for Xpert Xpress SARS-CoV-2/FLU/RSV testing.  Fact Sheet for Patients: BloggerCourse.com  Fact Sheet for Healthcare Providers: SeriousBroker.it  This test is not yet approved or cleared by the  [place] FDA and has been authorized for detection and/or diagnosis of SARS-CoV-2 by FDA under an Emergency Use Authorization (EUA). This EUA will remain in effect (meaning this test can be used) for the duration of the COVID-19 declaration under Section 564(b)(1) of the Act, 21 U.S.C. section 360bbb-3(b)(1), unless the authorization is terminated or revoked.  Performed at [facility], [address], [place], [place] [address]      Time coordinating discharge: Over 30 minutes  SIGNED:   Marinda Elk, MD  [facility] 01/03/2021, 10:11 AM Pager   If 7PM-7AM, please contact night-coverage [email or web address] Password [email or web address]

## 2021-01-03 NOTE — Progress Notes (Signed)
Pt had 10 beat run of vtach. MD notified.

## 2021-01-03 NOTE — Plan of Care (Signed)

## 2021-01-03 NOTE — Progress Notes (Signed)
Advanced Heart Failure Rounding Note   Subjective:    Cath yesterday with improved hemodynamics and normal coronaries  Feels ok  No CP or SOB   Objective:   Weight Range:  Vital Signs:   Temp:  [98 F (36.7 C)] 98 F (36.7 C) (06/03 0144) Pulse Rate:  [76-97] 94 (06/03 0144) Resp:  [6-25] 16 (06/03 0144) BP: (127-160)/(82-107) 135/90 (06/03 0144) SpO2:  [0 %-100 %] 99 % (06/03 0144) Last BM Date: 01/01/21  Weight change: Filed Weights   12/31/20 0421 01/01/21 0413 01/02/21 0533  Weight: (!) 146.8 kg (!) 136.6 kg 132.2 kg    Intake/Output:   Intake/Output Summary (Last 24 hours) at 01/03/2021 0538 Last data filed at 01/03/2021 0400 Gross per 24 hour  Intake 383.28 ml  Output 1850 ml  Net -1466.72 ml      Telemetry: NSR 90s Personally reviewed   Labs: Basic Metabolic Panel: Recent Labs  Lab 12/30/20 0224 12/30/20 1541 12/31/20 0431 01/01/21 0500 01/02/21 0500 01/02/21 1726 01/02/21 1729 01/02/21 1738 01/03/21 0350  NA 141 140 140 141 139 139  141 139 143 137  K 3.1* 3.5 3.5 4.2 4.5 4.1  4.0 4.1 3.5 4.0  CL 101 102 98 99 97*  --   --   --  99  CO2 30 30 33* 34* 34*  --   --   --  31  GLUCOSE 126* 127* 121* 105* 115*  --   --   --  116*  BUN 13 13 12 11 17   --   --   --  16  CREATININE 1.00 1.02* 0.94 1.10* 1.06*  --   --   --  0.85  CALCIUM 8.3* 8.2* 8.3* 8.3* 8.5*  --   --   --  8.0*  MG 1.6*  --  1.8  --   --   --   --   --   --     Liver Function Tests: Recent Labs  Lab 12/27/20 0908 12/28/20 2056 12/29/20 1904 12/30/20 0224 12/31/20 0431 01/01/21 0500  AST 24 32  --  32 25 21  ALT 21 23  --  23 20 19   ALKPHOS 144* 111  --  108 94 85  BILITOT 1.3* 1.7* 1.5* 1.5* 1.1 1.1  PROT 6.9 6.8  --  6.9 6.5 6.1*  ALBUMIN 3.8 3.3*  --  2.9* 2.8* 2.6*   No results for input(s): LIPASE, AMYLASE in the last 168 hours. No results for input(s): AMMONIA in the last 168 hours.  CBC: Recent Labs  Lab 12/27/20 0908 12/28/20 2056 12/28/20 2056  12/29/20 0500 12/30/20 0224 12/31/20 0431 01/01/21 0500 01/02/21 0500 01/02/21 1726 01/02/21 1729 01/02/21 1738  WBC 5.7 7.1   < > 6.6 5.8 6.3 6.6 6.5  --   --   --   NEUTROABS 3.7 4.6  --   --   --   --   --  4.0  --   --   --   HGB 14.3 14.9   < > 13.7 13.8 13.0 12.6 12.9 14.6  15.0 14.6 13.3  HCT 45.1 47.0*   < > 42.9 43.6 41.1 40.7 41.2 43.0  44.0 43.0 39.0  MCV 83 84.1   < > 82.3 82.6 83.2 84.6 84.6  --   --   --   PLT 414 427*   < > 367 382 331 313 304  --   --   --    < > =  values in this interval not displayed.    Cardiac Enzymes: No results for input(s): CKTOTAL, CKMB, CKMBINDEX, TROPONINI in the last 168 hours.  BNP: BNP (last 3 results) Recent Labs    12/27/20 0908 12/28/20 2056  BNP 3,439.9* 2,539.8*    ProBNP (last 3 results) No results for input(s): PROBNP in the last 8760 hours.    Other results:  Imaging: CARDIAC CATHETERIZATION  Result Date: 01/02/2021 Findings: Ao = 132/92 (111) LV = 137/17 RA = 10 RV = 49/15 PA = 53/15 (29) PCW = 12 Fick cardiac output/index = 6.2/2.6 PVR = 2.7 WU SVR = 1297 FA sat = 99% PA sat = 70%, 71% SVC sat = 81% Assessment: 1. Normal coronary arteries 2. Severe NICM 3. Mild PAH 4. Relatively well-compensated left-sided filling pressures with normal cardiac output Plan/Discussion: Medical therapy. Arvilla Meres, MD 6:12 PM    Medications:     Scheduled Medications: . bacitracin   Topical BID  . carvedilol  3.125 mg Oral BID WC  . Chlorhexidine Gluconate Cloth  6 each Topical Daily  . digoxin  0.125 mg Oral Daily  . enoxaparin (LOVENOX) injection  65 mg Subcutaneous Q24H  . hydrALAZINE  25 mg Oral Q8H  . insulin aspart  0-5 Units Subcutaneous QHS  . insulin aspart  0-9 Units Subcutaneous TID WC  . sacubitril-valsartan  1 tablet Oral BID  . sodium chloride flush  3 mL Intravenous Q12H  . spironolactone  25 mg Oral Daily    Infusions: . sodium chloride      PRN Medications: sodium chloride, acetaminophen,  alum & mag hydroxide-simeth, hydrALAZINE, ondansetron (ZOFRAN) IV, sodium chloride flush   Assessment/Plan:   1. Acute Biventricular Heart Failure - Echo: LVEF 20-25% w/ global HK, mild LVH, GIIDD. RV mildly enlarged w/ moderately reduced systolic function and severely elevated pulmonary artery pressure. RVSP 61.0 mmHg - Cath 6/2 with normal cors. Improved hemodynamics. Suspect HTN CM - Likely can go home today on - Entresto 97-103 bid - Spiro 25 mg - Digoxin 0.125 - Hydralazine 50 tid  - Carvedilol 3.125 bid  - Torsemide 40 daily - K 40 daily - SGLT2i in clinic  2. Pulmonary HTN - RVSP severely elevated on echo, 61 mmHG. RV mildly enlarged, systolic fx moderately reduced - Needs RHC after diuresis - V/Q scan negative for PE - CTD Serologies negative  - Outpatient Sleep study to r/o OSA  - Outpatient PFTs. No tobacco history but ? Restriction given body habitus/ obesity    3. Type2DM - Hgb A1c 7.4  - insulin per primary  - SGLT2i soon   4. HTN - titrate GDMT per above - needs sleep study   5. Obesity Body mass index is 51.49 kg/m (pended).  - needs wt loss    Length of Stay: 6   Jacqueline Buice PA-C  01/03/2021, 5:38 AM  Advanced Heart Failure Team Pager [phone] (M-F; 7a - 4p)  Please contact [facility] for night-coverage after hours (4p -7a ) and weekends on [email or web address]

## 2021-01-06 ENCOUNTER — Other Ambulatory Visit: Payer: Self-pay | Admitting: *Deleted

## 2021-01-06 NOTE — Patient Outreach (Signed)
[facility]) Care Management  01/06/2021  Jacqueline Ward Jan 12, 1976 [id number]    Transition of care telephone call  Referral received:12/31/20 Initial outreach:01/06/21 Insurance: [facility]   Initial unsuccessful telephone call to patient's preferred number in order to complete transition of care assessment; no answer, left HIPAA compliant voicemail message requesting return call.   Objective:  Jacqueline Ward was hospitalized at [facility] 5/28-01/03/21 for New  Acute congestive heart failure, EF 20% mildred enlarged right right ventricular and reduced systolic function.  Comorbidities include: Type 2  Diabetes, A1c 7.4%, hypertension,  She was discharged to home on 01/03/21 without the need for home health services or DME.   Plan: This RNCM will route unsuccessful outreach letter with [facility] Care Management pamphlet and 24 hour Nurse Advice Line Magnet to [employer] Care Management clinical pool to be mailed to patient's home address. This RNCM will attempt another outreach within 4 business days.   Egbert Garibaldi, RN, BSN  [facility] Care Management,Care Management Coordinator  [phone]- Mobile [phone]- Toll Free Main Office

## 2021-01-10 ENCOUNTER — Encounter: Payer: Self-pay | Admitting: *Deleted

## 2021-01-10 ENCOUNTER — Other Ambulatory Visit: Payer: Self-pay | Admitting: *Deleted

## 2021-01-10 NOTE — Patient Outreach (Signed)
[facility]) Care Management  01/10/2021  Jacqueline Ward June 05, 1976 [id number]   Transition of care call/case closure   Referral received:12/31/20 Initial outreach:01/06/21 Insurance: [facility]   Subjective: 2nd attempt successful telephone call to patient's preferred number in order to complete transition of care assessment; 2 HIPAA identifiers verified. Explained purpose of call and completed transition of care assessment.  Jacqueline Ward states that she is doing okay, tires out . She denies increase in swelling , shortness of breath, no sudden weight gain, discussed weight at discharge 289.3 reports today's home weight 265.4. She continues to monitor blood pressures at home , recent readings am 130/80 and at hs 120/90. She continues to tolerate diet,limiting salt reports having a trainer that has provided addition diet information. denies bowel or bladder problems.  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 Patient to discuss with provider at visit referral for sleep study.  Reviewed accessing the following [place] Benefits : Discussed current health issues and says she is agreeable to a referral to one of the [place] chronic disease management programs as explained, encouraged to view [facility] [email or web address] for additional education programs.  She unsure of if she has  the hospital indemnity plan , provided [employer] contact number [phone] to file claim if needed.  She has filed Nurse, adult term disability benefits.  She uses a [facility] outpatient pharmacy.     Objective:  Jacqueline Ward was hospitalized at [facility] 5/28-01/03/21 for New  Acute congestive heart failure, EF 20% mildred enlarged right right ventricular and reduced systolic function.  Comorbidities  include: Type 2  Diabetes, A1c 7.4%, hypertension, She was discharged to home on 01/03/21 without the need for home health services or DME.     Assessment:  Patient voices good understanding of all discharge instructions.  See transition of care flowsheet for assessment details.   Plan:  Reviewed hospital discharge diagnosis of Heart Failure   and discharge treatment plan using hospital discharge instructions, assessing medication adherence, reviewing problems requiring provider notification, and discussing the importance of follow up with surgeon, primary care provider and/or specialists as directed.  Reviewed [place] healthy lifestyle program information to receive discounted premium for  2023   Step 1: Get  your annual physical  Step 2: Complete your health assessment  Step 3:Identify your current health status and complete the corresponding action step between August 03, 2020 and April 03, 2021.    Using Active Health Management ActiveAdvice View website, with patient verbal agreement enrolled to participate in [place]'s Active Health Management chronic disease management program.    No ongoing care management needs identified so will close case to [facility] Care Management services. Thanked patient for their services to [facility].  Egbert Garibaldi, RN, BSN  [facility] Care Management,Care Management Coordinator  [phone]- Mobile [phone]- Toll Free Main Office

## 2021-01-13 ENCOUNTER — Encounter (HOSPITAL_COMMUNITY): Payer: Self-pay | Admitting: *Deleted

## 2021-01-13 NOTE — Progress Notes (Signed)
Pt's FMLA forms completed, signed by Dr Gala Romney, and faxed to [employer] at [phone], pt will p/u copy at her OV 6/16

## 2021-01-15 NOTE — Progress Notes (Signed)
Advanced Heart Failure Clinic Note  PCP: Bjorn Pippin, NP HF Cardiologist: Dr. 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  She presented to ED w/ 1 month h/o fluid retention w/ >20 lb wt gain and progressive SOB, found to be in acute CHF. Echo showed severe biventricular failure. LVEF 20-25% w/ global HK, mild LVH, GIIDD. RV mildly enlarged w/ moderately reduced systolic function and severely elevated pulmonary artery pressure. RVSP 61.0 mmHg. Trival MR, mild TR. No effusion. She was diuresed with IV Lasix & started on GDMT. Wt down 16 lb. Discharge weight 289 lbs.   Today she returns for hospital HF follow up. Overall feeling fine, somewhat SOB with stairs, limited more by fatigue. Denies increasing SOB, CP, dizziness, edema, or PND/Orthopnea. Appetite ok. No fever or chills. Weight at home 256 pounds. Taking all medications. She is doing chair exercises with a trainer.  Cardiac Studies: - Echo (5/22): EF 20-25% w/ global HK, mild LVH, GIIDD. RV mildly enlarged w/ moderately reduced systolic function and severely elevated pulmonary artery pressure. RVSP 61.0 mmHg.  - R/LHC (6/22):  Findings: Ao = 132/92 (111) LV = 137/17 RA = 10 RV = 49/15 PA = 53/15 (29) PCW = 12 Fick cardiac output/index = 6.2/2.6 PVR = 2.7 WU SVR = 1297 FA sat = 99% PA sat = 70%, 71% SVC sat = 81%   Assessment: 1. Normal coronary arteries 2. Severe NICM 3. Mild PAH 4. Relatively well-compensated left-sided filling pressures with normal cardiac output  ROS: All systems negative except as listed in HPI, PMH and Problem List.  SH:  Social History   Socioeconomic History   Marital status: Single    Spouse name: Not on file   Number of children: 0   Years of education: Not on file   Highest education level: Not on file  Occupational History   Not on file  Tobacco Use   Smoking status: Never   Smokeless tobacco: Never  Vaping Use   Vaping  Use: Never used  Substance and Sexual Activity   Alcohol use: No    Alcohol/week: 0.0 standard drinks   Drug use: No   Sexual activity: Not Currently  Other Topics Concern   Not on file  Social History Narrative   Not on file   Social Determinants of Health   Financial Resource Strain: Low Risk    Difficulty of Paying Living Expenses: Not hard at all  Food Insecurity: No Food Insecurity   Worried About Programme researcher, broadcasting/film/video in the Last Year: Never true   Ran Out of Food in the Last Year: Never true  Transportation Needs: No Transportation Needs   Lack of Transportation (Medical): No   Lack of Transportation (Non-Medical): No  Physical Activity: Not on file  Stress: Not on file  Social Connections: Not on file  Intimate Partner Violence: Not on file   FH:  Family History  Problem Relation Age of Onset   Cancer Mother    Hypertension Mother    Pancreatitis Father    Diabetes Father    Past Medical History:  Diagnosis Date   Diabetes mellitus without complication (HCC)    Hypertension    Obese    Sialolithiasis of submandibular gland 2019   Current Outpatient Medications  Medication Sig Dispense Refill   albuterol (VENTOLIN HFA) 108 (90 Base) MCG/ACT inhaler Inhale 1-2 puffs into the lungs every 6 (six) hours as needed  for wheezing or shortness of breath. 1 each 2   carvedilol (COREG) 3.125 MG tablet Take 1 tablet (3.125 mg total) by mouth 2 (two) times daily with a meal. 60 tablet 3   digoxin (LANOXIN) 0.125 MG tablet Take 1 tablet (0.125 mg total) by mouth daily. 30 tablet 3   hydrALAZINE (APRESOLINE) 25 MG tablet Take 1 tablet (25 mg total) by mouth every 8 (eight) hours. 90 tablet 3   metFORMIN (GLUCOPHAGE) 500 MG tablet Take 1 tablet (500 mg total) by mouth 2 (two) times daily with a meal. 60 tablet 11   potassium chloride SA (KLOR-CON) 20 MEQ tablet Take 2 tablets (40 mEq total) by mouth daily. 30 tablet 3   sacubitril-valsartan (ENTRESTO) 97-103 MG Take 1 tablet by  mouth 2 (two) times daily. 60 tablet 3   spironolactone (ALDACTONE) 25 MG tablet Take 1 tablet (25 mg total) by mouth daily. 30 tablet 3   torsemide (DEMADEX) 20 MG tablet Take 2 tablets (40 mg total) by mouth daily. 60 tablet 3   No current facility-administered medications for this encounter.   Vitals:   01/16/21 1532  BP: 110/74  Pulse: 99  SpO2: 96%  Weight: 118.3 kg (260 lb 12.8 oz)   Wt Readings from Last 3 Encounters:  01/16/21 118.3 kg (260 lb 12.8 oz)  01/03/21 131.5 kg (289 lb 14.4 oz)  12/27/20 (!) 152.9 kg (337 lb)   PHYSICAL EXAM: General:  NAD. No resp difficulty HEENT: Normal Neck: Supple. No JVD. Carotids 2+ bilat; no bruits. No lymphadenopathy or thryomegaly appreciated. Cor: PMI nondisplaced. Regular rate & rhythm. No rubs, gallops or murmurs. Lungs: Clear Abdomen: Soft, nontender, nondistended. No hepatosplenomegaly. No bruits or masses. Good bowel sounds. Extremities: No cyanosis, clubbing, rash, edema Neuro: Alert & oriented x 3, cranial nerves grossly intact. Moves all 4 extremities w/o difficulty. Affect pleasant.  ECG: SR 97 bpm (personally reviewed)  ASSESSMENT & PLAN:  1. Chronic Heart Failure - Echo: LVEF 20-25% w/ global HK, mild LVH, GIIDD. RV mildly enlarged w/ moderately reduced systolic function and severely elevated pulmonary artery pressure. RVSP 61.0 mmHg - Cath 6/2 with normal cors. Improved hemodynamics.  - Suspect HTN CM. - NYHA II, volume good. - Continue Entresto 97-103 mg bid. - Continue Spiro 25 mg daily. - Continue Digoxin 0.125 mg daily. - Continue Hydralazine 25 mg tid. - Continue carvedilol 3.125 mg bid. - Continue torsemide 40 mg daily + KCl 40 mEq daily. - Start Farxiga 10 mg daily. - BMET & digoxin today, repeat BMET 7-10 days..   2. Pulmonary HTN - RVSP severely elevated on echo, 61 mmHG. RV mildly enlarged, systolic fx moderately reduced. - V/Q scan negative for PE. - CTD Serologies negative. - ? PFTs (No tobacco  history but ? restriction given body habitus/ obesity). - Sleep study to r/o OSA     3. Type 2 DM - Hgb A1c 7.4. - Start Farxiga.   4. HTN - Stable. - Arrange sleep study.   5. Obesity - Body mass index is 42.09 kg/m. - Needs wt loss.    Follow up in 3-4 weeks with pharmD for further medication titration and 2 months with APP. Plan to increase carvedilol next.  Prince Rome, FNP-BC 01/16/21

## 2021-01-16 ENCOUNTER — Other Ambulatory Visit: Payer: Self-pay

## 2021-01-16 ENCOUNTER — Encounter (HOSPITAL_COMMUNITY): Payer: Self-pay

## 2021-01-16 ENCOUNTER — Other Ambulatory Visit (HOSPITAL_COMMUNITY): Payer: Self-pay

## 2021-01-16 ENCOUNTER — Ambulatory Visit (HOSPITAL_COMMUNITY)
Admission: RE | Admit: 2021-01-16 | Discharge: 2021-01-16 | Disposition: A | Discharge status: Home / Self Care | Payer: No Typology Code available for payment source | Source: Ambulatory Visit | Attending: Family Medicine | Admitting: Family Medicine

## 2021-01-16 VITALS — BP 110/74 | HR 99 | Wt 260.8 lb

## 2021-01-16 DIAGNOSIS — I5022 Chronic systolic (congestive) heart failure: Secondary | ICD-10-CM

## 2021-01-16 DIAGNOSIS — Z7984 Long term (current) use of oral hypoglycemic drugs: Secondary | ICD-10-CM | POA: Insufficient documentation

## 2021-01-16 DIAGNOSIS — I110 Hypertensive heart disease with heart failure: Secondary | ICD-10-CM | POA: Insufficient documentation

## 2021-01-16 DIAGNOSIS — I272 Pulmonary hypertension, unspecified: Secondary | ICD-10-CM

## 2021-01-16 DIAGNOSIS — Z6841 Body Mass Index (BMI) 40.0 and over, adult: Secondary | ICD-10-CM | POA: Diagnosis not present

## 2021-01-16 DIAGNOSIS — Z79899 Other long term (current) drug therapy: Secondary | ICD-10-CM | POA: Insufficient documentation

## 2021-01-16 DIAGNOSIS — E669 Obesity, unspecified: Secondary | ICD-10-CM | POA: Insufficient documentation

## 2021-01-16 DIAGNOSIS — Z8249 Family history of ischemic heart disease and other diseases of the circulatory system: Secondary | ICD-10-CM | POA: Insufficient documentation

## 2021-01-16 DIAGNOSIS — I5082 Biventricular heart failure: Secondary | ICD-10-CM | POA: Insufficient documentation

## 2021-01-16 DIAGNOSIS — E119 Type 2 diabetes mellitus without complications: Secondary | ICD-10-CM

## 2021-01-16 DIAGNOSIS — I10 Essential (primary) hypertension: Secondary | ICD-10-CM

## 2021-01-16 LAB — BASIC METABOLIC PANEL
Anion gap: 12 (ref 5–15)
BUN: 18 mg/dL (ref 6–20)
CO2: 29 mmol/L (ref 22–32)
Calcium: 9.5 mg/dL (ref 8.9–10.3)
Chloride: 98 mmol/L (ref 98–111)
Creatinine, Ser: 1.05 mg/dL — ABNORMAL HIGH (ref 0.44–1.00)
GFR, Estimated: >60 mL/min (ref >60)
Glucose, Bld: 129 mg/dL — ABNORMAL HIGH (ref 70–99)
Potassium: 4.4 mmol/L (ref 3.5–5.1)
Sodium: 139 mmol/L (ref 135–145)

## 2021-01-16 LAB — DIGOXIN LEVEL: Digoxin Level: 0.3 ng/mL — ABNORMAL LOW (ref 0.8–2.0)

## 2021-01-16 MED ORDER — POTASSIUM CHLORIDE CRYS ER 10 MEQ PO TBCR: 40.0000 meq | EXTENDED_RELEASE_TABLET | Freq: Every day | ORAL | 3 refills | Status: DC

## 2021-01-16 MED ORDER — DAPAGLIFLOZIN PROPANEDIOL 10 MG PO TABS: 10.0000 mg | ORAL_TABLET | Freq: Every day | ORAL | 11 refills | Status: DC

## 2021-01-16 NOTE — Patient Instructions (Signed)
START Farxiga 10 mg, one tab daily  Labs today We will only contact you if something comes back abnormal or we need to make some changes. Otherwise no news is good news!  Labs needed in 7-10 days  Your physician recommends that you schedule a follow-up appointment in: 3-4 weeks with the pharmacy team and in 8 weeks  in the Advanced Practitioners (PA/NP) Clinic    Do the following things EVERYDAY: Weigh yourself in the morning before breakfast. Write it down and keep it in a log. Take your medicines as prescribed Eat low salt foods--Limit salt (sodium) to 2000 mg per day.  Stay as active as you can everyday Limit all fluids for the day to less than 2 liters  At the Advanced Heart Failure Clinic, you and your health needs are our priority. As part of our continuing mission to provide you with exceptional heart care, we have created designated Provider Care Teams. These Care Teams include your primary Cardiologist (physician) and Advanced Practice Providers (APPs- Physician Assistants and Nurse Practitioners) who all work together to provide you with the care you need, when you need it.   You may see any of the following providers on your designated Care Team at your next follow up: Dr Arvilla Meres Dr Marca Ancona Dr Brandon Melnick, NP Robbie Lis, [place] Mikki Santee Karle Plumber, PharmD   Please be sure to bring in all your medications bottles to every appointment.   If you have any questions or concerns before your next appointment please send [place] a message through [place] or call our office at [phone].    TO LEAVE A MESSAGE FOR THE NURSE SELECT OPTION 2, PLEASE LEAVE A MESSAGE INCLUDING: YOUR NAME DATE OF BIRTH CALL BACK NUMBER REASON FOR CALL**this is important as we prioritize the call backs  YOU WILL RECEIVE A CALL BACK THE SAME DAY AS LONG AS YOU CALL BEFORE 4:00 PM

## 2021-01-21 ENCOUNTER — Encounter: Payer: No Typology Code available for payment source | Admitting: Nurse Practitioner

## 2021-01-21 ENCOUNTER — Telehealth (HOSPITAL_COMMUNITY): Payer: Self-pay | Admitting: *Deleted

## 2021-01-21 NOTE — Telephone Encounter (Signed)
Sleep study pre cert faxed to [facility] [phone]

## 2021-01-27 ENCOUNTER — Ambulatory Visit (HOSPITAL_COMMUNITY)
Admission: RE | Admit: 2021-01-27 | Discharge: 2021-01-27 | Disposition: A | Discharge status: Home / Self Care | Payer: No Typology Code available for payment source | Source: Ambulatory Visit | Attending: Internal Medicine | Admitting: Internal Medicine

## 2021-01-27 ENCOUNTER — Other Ambulatory Visit: Payer: Self-pay

## 2021-01-27 DIAGNOSIS — I5022 Chronic systolic (congestive) heart failure: Secondary | ICD-10-CM | POA: Diagnosis not present

## 2021-01-27 LAB — BASIC METABOLIC PANEL
Anion gap: 10 (ref 5–15)
BUN: 15 mg/dL (ref 6–20)
CO2: 27 mmol/L (ref 22–32)
Calcium: 9.4 mg/dL (ref 8.9–10.3)
Chloride: 101 mmol/L (ref 98–111)
Creatinine, Ser: 0.85 mg/dL (ref 0.44–1.00)
GFR, Estimated: >60 mL/min (ref >60)
Glucose, Bld: 85 mg/dL (ref 70–99)
Potassium: 3.9 mmol/L (ref 3.5–5.1)
Sodium: 138 mmol/L (ref 135–145)

## 2021-01-30 ENCOUNTER — Other Ambulatory Visit (HOSPITAL_COMMUNITY): Payer: Self-pay

## 2021-01-31 ENCOUNTER — Other Ambulatory Visit (HOSPITAL_COMMUNITY): Payer: Self-pay

## 2021-01-31 ENCOUNTER — Other Ambulatory Visit: Payer: Self-pay

## 2021-02-04 ENCOUNTER — Encounter (HOSPITAL_COMMUNITY): Payer: Self-pay

## 2021-02-05 ENCOUNTER — Encounter (HOSPITAL_COMMUNITY): Payer: Self-pay

## 2021-02-07 ENCOUNTER — Encounter (HOSPITAL_COMMUNITY): Payer: No Typology Code available for payment source

## 2021-02-10 ENCOUNTER — Other Ambulatory Visit: Payer: Self-pay

## 2021-02-10 ENCOUNTER — Encounter: Payer: Self-pay | Admitting: Nurse Practitioner

## 2021-02-10 ENCOUNTER — Ambulatory Visit (INDEPENDENT_AMBULATORY_CARE_PROVIDER_SITE_OTHER): Payer: No Typology Code available for payment source | Admitting: Nurse Practitioner

## 2021-02-10 VITALS — BP 134/85 | HR 101 | Temp 98.1°F | Resp 18 | Ht 66.0 in | Wt 252.8 lb

## 2021-02-10 DIAGNOSIS — I10 Essential (primary) hypertension: Secondary | ICD-10-CM

## 2021-02-10 DIAGNOSIS — Z124 Encounter for screening for malignant neoplasm of cervix: Secondary | ICD-10-CM | POA: Diagnosis not present

## 2021-02-10 DIAGNOSIS — Z0001 Encounter for general adult medical examination with abnormal findings: Secondary | ICD-10-CM | POA: Diagnosis not present

## 2021-02-10 DIAGNOSIS — E1165 Type 2 diabetes mellitus with hyperglycemia: Secondary | ICD-10-CM

## 2021-02-10 NOTE — Assessment & Plan Note (Signed)
Lab Results  Component Value Date   HGBA1C 7.4 (H) 12/27/2020   -continue metformin -recheck A1c with next set of labs -on ARB with Entresto -no statin currently

## 2021-02-10 NOTE — Patient Instructions (Signed)
Please have fasting labs drawn 2-3 days prior to your appointment so we can discuss the results during your office visit.  

## 2021-02-10 NOTE — Progress Notes (Signed)
Established Patient Office Visit  Subjective:  Patient ID: Jacqueline Ward, female    DOB: 04-08-1976  Age: 45 y.o. MRN: [id number]  CC:  Chief Complaint  Patient presents with   Annual Exam    Annual exam     HPI Jacqueline Ward presents for annual physical exam.  No acute concerns today.  She is followed by HF clinic and Dr. Haroldine Laws and his PA. She will have echo in Sept.  She has hx of DM.  Past Medical History:  Diagnosis Date   Diabetes mellitus without complication ([place])    Hypertension    Obese    Sialolithiasis of submandibular gland 2019   Wrist fracture, left, with routine healing, subsequent encounter 09/03/2014    Past Surgical History:  Procedure Laterality Date   LIPECTOMY  2004   RIGHT/LEFT HEART CATH AND CORONARY ANGIOGRAPHY N/A 01/02/2021   Procedure: RIGHT/LEFT HEART CATH AND CORONARY ANGIOGRAPHY;  Surgeon: Jolaine Artist, MD;  Location: [facility];  Service: Cardiovascular;  Laterality: N/A;    Family History  Problem Relation Age of Onset   Cancer Mother    Hypertension Mother    Pancreatitis Father    Diabetes Father     Social History   Socioeconomic History   Marital status: Single    Spouse name: Not on file   Number of children: 0   Years of education: Not on file   Highest education level: Not on file  Occupational History   Not on file  Tobacco Use   Smoking status: Never   Smokeless tobacco: Never  Vaping Use   Vaping Use: Never used  Substance and Sexual Activity   Alcohol use: No    Alcohol/week: 0.0 standard drinks   Drug use: No   Sexual activity: Not Currently  Other Topics Concern   Not on file  Social History Narrative   Not on file   Social Determinants of Health   Financial Resource Strain: Low Risk    Difficulty of Paying Living Expenses: Not hard at all  Food Insecurity: No Food Insecurity   Worried About Charity fundraiser in the Last Year: Never true   [place] in the Last Year: Never  true  Transportation Needs: No Transportation Needs   Lack of Transportation (Medical): No   Lack of Transportation (Non-Medical): No  Physical Activity: Not on file  Stress: Not on file  Social Connections: Not on file  Intimate Partner Violence: Not on file    Outpatient Medications Prior to Visit  Medication Sig Dispense Refill   albuterol (VENTOLIN HFA) 108 (90 Base) MCG/ACT inhaler Inhale 1-2 puffs into the lungs every 6 (six) hours as needed for wheezing or shortness of breath. 1 each 2   carvedilol (COREG) 3.125 MG tablet Take 1 tablet (3.125 mg total) by mouth 2 (two) times daily with a meal. 60 tablet 3   dapagliflozin propanediol (FARXIGA) 10 MG TABS tablet Take 1 tablet (10 mg total) by mouth daily before breakfast. 30 tablet 11   digoxin (LANOXIN) 0.125 MG tablet Take 1 tablet (0.125 mg total) by mouth daily. 30 tablet 3   hydrALAZINE (APRESOLINE) 25 MG tablet Take 1 tablet (25 mg total) by mouth every 8 (eight) hours. 90 tablet 3   metFORMIN (GLUCOPHAGE) 500 MG tablet Take 1 tablet (500 mg total) by mouth 2 (two) times daily with a meal. 60 tablet 11   potassium chloride SA (KLOR-CON) 10 MEQ tablet Take  4 tablets (40 mEq total) by mouth daily. 120 tablet 3   sacubitril-valsartan (ENTRESTO) 97-103 MG Take 1 tablet by mouth 2 (two) times daily. 60 tablet 3   spironolactone (ALDACTONE) 25 MG tablet Take 1 tablet (25 mg total) by mouth daily. 30 tablet 3   torsemide (DEMADEX) 20 MG tablet Take 2 tablets (40 mg total) by mouth daily. 60 tablet 3   No facility-administered medications prior to visit.    No Known Allergies  ROS Review of Systems  Constitutional: Negative.   HENT: Negative.    Eyes: Negative.   Respiratory: Negative.    Cardiovascular: Negative.   Gastrointestinal: Negative.   Endocrine: Negative.   Genitourinary: Negative.   Musculoskeletal: Negative.   Skin: Negative.   Allergic/Immunologic: Negative.   Neurological: Negative.   Hematological:  Negative.   Psychiatric/Behavioral: Negative.       Objective:    Physical Exam Constitutional:      Appearance: Normal appearance. She is obese.  HENT:     Head: Normocephalic and atraumatic.     Right Ear: Tympanic membrane, ear canal and external ear normal.     Left Ear: Tympanic membrane, ear canal and external ear normal.     Nose: Nose normal.     Mouth/Throat:     Mouth: Mucous membranes are moist.     Pharynx: Oropharynx is clear.  Eyes:     Extraocular Movements: Extraocular movements intact.     Conjunctiva/sclera: Conjunctivae normal.     Pupils: Pupils are equal, round, and reactive to light.  Cardiovascular:     Rate and Rhythm: Regular rhythm. Tachycardia present.     Pulses: Normal pulses.          Dorsalis pedis pulses are 2+ on the right side and 2+ on the left side.     Heart sounds: Normal heart sounds.  Pulmonary:     Effort: Pulmonary effort is normal.     Breath sounds: Normal breath sounds.  Abdominal:     General: Abdomen is flat. Bowel sounds are normal.  Musculoskeletal:        General: Normal range of motion.     Cervical back: Normal range of motion and neck supple.  Feet:     Right foot:     Protective Sensation: 10 sites tested.  10 sites sensed.     Skin integrity: Dry skin present.     Toenail Condition: Right toenails are normal.     Left foot:     Protective Sensation: 10 sites tested.  10 sites sensed.     Skin integrity: Dry skin present.     Toenail Condition: Left toenails are normal.  Skin:    General: Skin is warm and dry.  Neurological:     General: No focal deficit present.     Mental Status: She is alert and oriented to person, place, and time.     Cranial Nerves: No cranial nerve deficit.     Sensory: No sensory deficit.     Motor: No weakness.     Coordination: Coordination normal.     Gait: Gait normal.  Psychiatric:        Mood and Affect: Mood normal.        Behavior: Behavior normal.        Thought Content:  Thought content normal.        Judgment: Judgment normal.    BP 134/85 (BP Location: Right Arm, Patient Position: Sitting, Cuff Size: Normal)   Pulse Marland Kitchen)  101   Temp 98.1 F (36.7 C) (Oral)   Resp 18   Ht 5' 6"  (1.676 m)   Wt 252 lb 12.8 oz (114.7 kg)   SpO2 98%   BMI 40.80 kg/m  Wt Readings from Last 3 Encounters:  02/10/21 252 lb 12.8 oz (114.7 kg)  01/16/21 260 lb 12.8 oz (118.3 kg)  01/03/21 289 lb 14.4 oz (131.5 kg)     Health Maintenance Due  Topic Date Due   PNEUMOCOCCAL POLYSACCHARIDE VACCINE AGE 58-64 HIGH RISK  Never done   COVID-19 Vaccine (1) Never done   OPHTHALMOLOGY EXAM  Never done   TETANUS/TDAP  Never done   PAP SMEAR-Modifier  04/10/2018    There are no preventive care reminders to display for this patient.  Lab Results  Component Value Date   TSH 1.081 12/30/2020   Lab Results  Component Value Date   WBC 6.5 01/02/2021   HGB 13.3 01/02/2021   HCT 39.0 01/02/2021   MCV 84.6 01/02/2021   PLT 304 01/02/2021   Lab Results  Component Value Date   NA 138 01/27/2021   K 3.9 01/27/2021   CO2 27 01/27/2021   GLUCOSE 85 01/27/2021   BUN 15 01/27/2021   CREATININE 0.85 01/27/2021   BILITOT 1.1 01/01/2021   ALKPHOS 85 01/01/2021   AST 21 01/01/2021   ALT 19 01/01/2021   PROT 6.1 (L) 01/01/2021   ALBUMIN 2.6 (L) 01/01/2021   CALCIUM 9.4 01/27/2021   ANIONGAP 10 01/27/2021   EGFR 61 12/27/2020   Lab Results  Component Value Date   CHOL 168 12/27/2020   Lab Results  Component Value Date   HDL 47 12/27/2020   Lab Results  Component Value Date   LDLCALC 107 (H) 12/27/2020   Lab Results  Component Value Date   TRIG 73 12/27/2020   No results found for: CHOLHDL Lab Results  Component Value Date   HGBA1C 7.4 (H) 12/27/2020      Assessment & Plan:   Problem List Items Addressed This Visit       Cardiovascular and Mediastinum   Essential hypertension    -well controlled today       Relevant Orders   CBC with  Differential/Platelet   CMP14+EGFR   Lipid Panel With LDL/HDL Ratio     Endocrine   Type II diabetes mellitus, uncontrolled ([place])    Lab Results  Component Value Date   HGBA1C 7.4 (H) 12/27/2020  -continue metformin -recheck A1c with next set of labs -on ARB with Entresto -no statin currently       Relevant Orders   Lipid Panel With LDL/HDL Ratio   Hemoglobin A1c     Other   Pap smear for cervical cancer screening    -will have PAP with GYN       Other Visit Diagnoses     Encounter for general adult medical examination with abnormal findings    -  Primary   Relevant Orders   CBC with Differential/Platelet   CMP14+EGFR   Lipid Panel With LDL/HDL Ratio   Hemoglobin A1c   TSH   Routine cervical smear       Relevant Orders   Ambulatory referral to Obstetrics / Gynecology       No orders of the defined types were placed in this encounter.   Follow-up: Return in about 7 weeks (around 03/31/2021).    Noreene Larsson, NP

## 2021-02-10 NOTE — Assessment & Plan Note (Signed)
-  will have PAP with GYN

## 2021-02-10 NOTE — Assessment & Plan Note (Signed)
-  followed by cardiology, Behsimhon and team

## 2021-02-10 NOTE — Assessment & Plan Note (Signed)
-  well controlled today 

## 2021-02-24 ENCOUNTER — Ambulatory Visit (HOSPITAL_COMMUNITY): Payer: No Typology Code available for payment source

## 2021-02-27 ENCOUNTER — Other Ambulatory Visit (HOSPITAL_COMMUNITY): Payer: Self-pay

## 2021-02-27 ENCOUNTER — Other Ambulatory Visit (HOSPITAL_COMMUNITY): Payer: Self-pay | Admitting: *Deleted

## 2021-02-27 MED ORDER — POTASSIUM CHLORIDE CRYS ER 10 MEQ PO TBCR
40.0000 meq | EXTENDED_RELEASE_TABLET | Freq: Every day | ORAL | 3 refills | Status: AC
  Filled 2021-02-27 – 2021-03-03 (×3): qty 120, 30d supply, fill #0

## 2021-02-27 MED ORDER — DAPAGLIFLOZIN PROPANEDIOL 10 MG PO TABS
10.0000 mg | ORAL_TABLET | Freq: Every day | ORAL | 11 refills | Status: DC
  Filled 2021-02-27 – 2021-03-03 (×3): qty 30, 30d supply, fill #0
  Filled 2021-05-01: qty 90, 90d supply, fill #1
  Filled 2021-05-01: qty 30, 30d supply, fill #1
  Filled 2021-07-11 – 2021-08-14 (×3): qty 90, 90d supply, fill #2

## 2021-02-28 ENCOUNTER — Other Ambulatory Visit (HOSPITAL_COMMUNITY): Payer: Self-pay

## 2021-03-03 ENCOUNTER — Other Ambulatory Visit (HOSPITAL_COMMUNITY): Payer: Self-pay

## 2021-03-03 ENCOUNTER — Other Ambulatory Visit (HOSPITAL_BASED_OUTPATIENT_CLINIC_OR_DEPARTMENT_OTHER): Payer: Self-pay

## 2021-03-03 ENCOUNTER — Other Ambulatory Visit: Payer: Self-pay

## 2021-03-12 ENCOUNTER — Encounter (HOSPITAL_COMMUNITY): Payer: Self-pay | Admitting: *Deleted

## 2021-03-31 ENCOUNTER — Ambulatory Visit: Payer: No Typology Code available for payment source | Admitting: Nurse Practitioner

## 2021-05-01 ENCOUNTER — Other Ambulatory Visit (HOSPITAL_BASED_OUTPATIENT_CLINIC_OR_DEPARTMENT_OTHER): Payer: Self-pay

## 2021-05-01 ENCOUNTER — Other Ambulatory Visit: Payer: Self-pay

## 2021-06-06 ENCOUNTER — Encounter (HOSPITAL_COMMUNITY): Payer: Self-pay

## 2021-06-09 ENCOUNTER — Other Ambulatory Visit (HOSPITAL_COMMUNITY): Payer: Self-pay | Admitting: *Deleted

## 2021-06-09 ENCOUNTER — Other Ambulatory Visit (HOSPITAL_BASED_OUTPATIENT_CLINIC_OR_DEPARTMENT_OTHER): Payer: Self-pay

## 2021-06-09 MED ORDER — TORSEMIDE 20 MG PO TABS
40.0000 mg | ORAL_TABLET | Freq: Every day | ORAL | 3 refills | Status: AC
  Filled 2021-06-09: qty 60, 30d supply, fill #0
  Filled 2021-12-17: qty 60, 30d supply, fill #1
  Filled 2022-02-12: qty 60, 30d supply, fill #2
  Filled 2022-03-24: qty 60, 30d supply, fill #3

## 2021-06-09 MED ORDER — CARVEDILOL 3.125 MG PO TABS
3.1250 mg | ORAL_TABLET | Freq: Two times a day (BID) | ORAL | 3 refills | Status: AC
  Filled 2021-06-09: qty 60, 30d supply, fill #0
  Filled 2021-08-13: qty 180, 90d supply, fill #1
  Filled 2022-03-24: qty 60, 30d supply, fill #1

## 2021-06-19 ENCOUNTER — Encounter (HOSPITAL_BASED_OUTPATIENT_CLINIC_OR_DEPARTMENT_OTHER): Payer: Self-pay

## 2021-06-19 ENCOUNTER — Other Ambulatory Visit (HOSPITAL_BASED_OUTPATIENT_CLINIC_OR_DEPARTMENT_OTHER): Payer: Self-pay

## 2021-07-11 ENCOUNTER — Other Ambulatory Visit: Payer: Self-pay

## 2021-07-11 ENCOUNTER — Telehealth (HOSPITAL_COMMUNITY): Payer: Self-pay | Admitting: Surgery

## 2021-07-11 NOTE — Telephone Encounter (Signed)
I attempted to reach patient in reference to the incomplete ordered sleep study.  There was no answer and no ability to leave a message.

## 2021-07-14 ENCOUNTER — Other Ambulatory Visit (HOSPITAL_COMMUNITY): Payer: Self-pay

## 2021-07-15 ENCOUNTER — Other Ambulatory Visit (HOSPITAL_BASED_OUTPATIENT_CLINIC_OR_DEPARTMENT_OTHER): Payer: Self-pay

## 2021-07-31 ENCOUNTER — Telehealth (HOSPITAL_COMMUNITY): Payer: Self-pay | Admitting: Surgery

## 2021-07-31 NOTE — Telephone Encounter (Signed)
Information sent for patient to be charged for home sleep study device.  Patient has not completed the study after several unsuccessful attempts at phone calls and reminders.

## 2021-08-13 ENCOUNTER — Other Ambulatory Visit (HOSPITAL_BASED_OUTPATIENT_CLINIC_OR_DEPARTMENT_OTHER): Payer: Self-pay

## 2021-08-14 ENCOUNTER — Other Ambulatory Visit (HOSPITAL_BASED_OUTPATIENT_CLINIC_OR_DEPARTMENT_OTHER): Payer: Self-pay

## 2021-08-18 ENCOUNTER — Other Ambulatory Visit (HOSPITAL_BASED_OUTPATIENT_CLINIC_OR_DEPARTMENT_OTHER): Payer: Self-pay

## 2021-08-19 ENCOUNTER — Other Ambulatory Visit (HOSPITAL_BASED_OUTPATIENT_CLINIC_OR_DEPARTMENT_OTHER): Payer: Self-pay

## 2021-08-26 ENCOUNTER — Other Ambulatory Visit (HOSPITAL_BASED_OUTPATIENT_CLINIC_OR_DEPARTMENT_OTHER): Payer: Self-pay

## 2021-11-05 DIAGNOSIS — I5022 Chronic systolic (congestive) heart failure: Secondary | ICD-10-CM

## 2021-12-19 ENCOUNTER — Other Ambulatory Visit (HOSPITAL_COMMUNITY): Payer: Self-pay

## 2021-12-20 ENCOUNTER — Other Ambulatory Visit (HOSPITAL_COMMUNITY): Payer: Self-pay

## 2021-12-22 ENCOUNTER — Other Ambulatory Visit (HOSPITAL_COMMUNITY): Payer: Self-pay

## 2022-02-12 ENCOUNTER — Other Ambulatory Visit (HOSPITAL_BASED_OUTPATIENT_CLINIC_OR_DEPARTMENT_OTHER): Payer: Self-pay

## 2022-03-07 ENCOUNTER — Encounter (HOSPITAL_COMMUNITY): Payer: Self-pay | Admitting: Internal Medicine

## 2022-03-07 ENCOUNTER — Other Ambulatory Visit (HOSPITAL_COMMUNITY): Payer: Self-pay | Admitting: Family Medicine

## 2022-03-09 ENCOUNTER — Other Ambulatory Visit (HOSPITAL_BASED_OUTPATIENT_CLINIC_OR_DEPARTMENT_OTHER): Payer: Self-pay

## 2022-03-09 MED ORDER — DAPAGLIFLOZIN PROPANEDIOL 10 MG PO TABS
10.0000 mg | ORAL_TABLET | Freq: Every day | ORAL | 11 refills | Status: AC
Start: 2022-03-09 — End: ?
  Filled 2022-03-09 (×2): qty 30, 30d supply, fill #0
  Filled 2022-04-17: qty 30, 30d supply, fill #1
  Filled 2022-06-22: qty 30, 30d supply, fill #2
  Filled 2022-07-24: qty 30, 30d supply, fill #3
  Filled 2022-08-31: qty 30, 30d supply, fill #4
  Filled 2022-10-07: qty 30, 30d supply, fill #5
  Filled 2022-11-12: qty 30, 30d supply, fill #6
  Filled 2023-01-01: qty 30, 30d supply, fill #7
  Filled 2023-01-29: qty 30, 30d supply, fill #8

## 2022-03-10 ENCOUNTER — Other Ambulatory Visit (HOSPITAL_COMMUNITY): Payer: Self-pay | Admitting: *Deleted

## 2022-03-10 ENCOUNTER — Other Ambulatory Visit (HOSPITAL_BASED_OUTPATIENT_CLINIC_OR_DEPARTMENT_OTHER): Payer: Self-pay

## 2022-03-24 ENCOUNTER — Other Ambulatory Visit (HOSPITAL_BASED_OUTPATIENT_CLINIC_OR_DEPARTMENT_OTHER): Payer: Self-pay

## 2022-04-17 ENCOUNTER — Other Ambulatory Visit (HOSPITAL_BASED_OUTPATIENT_CLINIC_OR_DEPARTMENT_OTHER): Payer: Self-pay

## 2022-06-22 ENCOUNTER — Other Ambulatory Visit (HOSPITAL_BASED_OUTPATIENT_CLINIC_OR_DEPARTMENT_OTHER): Payer: Self-pay

## 2022-07-24 ENCOUNTER — Other Ambulatory Visit (HOSPITAL_COMMUNITY): Payer: Self-pay

## 2022-07-24 ENCOUNTER — Telehealth (HOSPITAL_COMMUNITY): Payer: Self-pay | Admitting: Pharmacy Technician

## 2022-07-24 ENCOUNTER — Other Ambulatory Visit (HOSPITAL_BASED_OUTPATIENT_CLINIC_OR_DEPARTMENT_OTHER): Payer: Self-pay

## 2022-07-24 NOTE — Telephone Encounter (Signed)
Patient Advocate Encounter   Received notification from [employer] that prior authorization for Jacqueline Ward is required.   PA submitted on CoverMyMeds Key [id number] Status is pending   Will continue to follow.

## 2022-07-24 NOTE — Telephone Encounter (Signed)
Advanced Heart Failure Patient Advocate Encounter  Prior Authorization for Marcelline Deist has been approved.    PA# [id number] Effective dates: 07/24/22 through 07/24/23  Patient would need office visit before any refills can be sent in.  Archer Asa, CPhT

## 2022-07-25 ENCOUNTER — Other Ambulatory Visit (HOSPITAL_BASED_OUTPATIENT_CLINIC_OR_DEPARTMENT_OTHER): Payer: Self-pay

## 2022-07-28 ENCOUNTER — Other Ambulatory Visit (HOSPITAL_BASED_OUTPATIENT_CLINIC_OR_DEPARTMENT_OTHER): Payer: Self-pay

## 2022-10-08 ENCOUNTER — Other Ambulatory Visit (HOSPITAL_BASED_OUTPATIENT_CLINIC_OR_DEPARTMENT_OTHER): Payer: Self-pay

## 2022-10-13 ENCOUNTER — Other Ambulatory Visit (HOSPITAL_BASED_OUTPATIENT_CLINIC_OR_DEPARTMENT_OTHER): Payer: Self-pay

## 2023-01-01 ENCOUNTER — Other Ambulatory Visit (HOSPITAL_COMMUNITY): Payer: Self-pay

## 2023-02-22 ENCOUNTER — Other Ambulatory Visit (HOSPITAL_COMMUNITY): Payer: Self-pay

## 2023-02-23 ENCOUNTER — Other Ambulatory Visit (HOSPITAL_COMMUNITY): Payer: Self-pay
